# Patient Record
Sex: Male | Born: 1995 | Hispanic: Yes | Marital: Single | Smoking: Never smoker
Health system: Southern US, Community
[De-identification: ages and names within clinical notes are randomized; demographics above are authoritative.]

## PROBLEM LIST (undated history)

## (undated) DIAGNOSIS — J189 Pneumonia, unspecified organism: Secondary | ICD-10-CM

## (undated) HISTORY — PX: ELBOW SURGERY: SHX618

---
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
## 2018-09-15 DIAGNOSIS — M25349 Other instability, unspecified hand: Secondary | ICD-10-CM | POA: Insufficient documentation

## 2018-12-31 DIAGNOSIS — Z969 Presence of functional implant, unspecified: Secondary | ICD-10-CM | POA: Insufficient documentation

## 2019-01-15 DIAGNOSIS — M25632 Stiffness of left wrist, not elsewhere classified: Secondary | ICD-10-CM | POA: Insufficient documentation

## 2019-01-15 DIAGNOSIS — Z9889 Other specified postprocedural states: Secondary | ICD-10-CM | POA: Insufficient documentation

## 2023-03-15 ENCOUNTER — Inpatient Hospital Stay (HOSPITAL_COMMUNITY): Payer: Self-pay

## 2023-03-15 ENCOUNTER — Emergency Department (HOSPITAL_COMMUNITY): Payer: Self-pay | Admitting: Anesthesiology

## 2023-03-15 ENCOUNTER — Emergency Department (HOSPITAL_COMMUNITY): Payer: Self-pay

## 2023-03-15 ENCOUNTER — Encounter (HOSPITAL_COMMUNITY): Admission: EM | Disposition: A | Discharge status: Rehabilitation Facility | Payer: Self-pay | Source: Home / Self Care

## 2023-03-15 ENCOUNTER — Encounter (HOSPITAL_COMMUNITY): Payer: Self-pay

## 2023-03-15 ENCOUNTER — Inpatient Hospital Stay (HOSPITAL_COMMUNITY)
Admission: EM | Admit: 2023-03-15 | Discharge: 2023-03-28 | DRG: 957 | Disposition: A | Discharge status: Rehabilitation Facility | Payer: Self-pay | Attending: General Surgery | Admitting: General Surgery

## 2023-03-15 DIAGNOSIS — S329XXB Fracture of unspecified parts of lumbosacral spine and pelvis, initial encounter for open fracture: Secondary | ICD-10-CM

## 2023-03-15 DIAGNOSIS — S334XXA Traumatic rupture of symphysis pubis, initial encounter: Secondary | ICD-10-CM | POA: Diagnosis present

## 2023-03-15 DIAGNOSIS — T794XXA Traumatic shock, initial encounter: Secondary | ICD-10-CM | POA: Diagnosis present

## 2023-03-15 DIAGNOSIS — S79911A Unspecified injury of right hip, initial encounter: Secondary | ICD-10-CM

## 2023-03-15 DIAGNOSIS — T148XXA Other injury of unspecified body region, initial encounter: Secondary | ICD-10-CM

## 2023-03-15 DIAGNOSIS — T1490XA Injury, unspecified, initial encounter: Principal | ICD-10-CM

## 2023-03-15 DIAGNOSIS — S32810B Multiple fractures of pelvis with stable disruption of pelvic ring, initial encounter for open fracture: Secondary | ICD-10-CM | POA: Diagnosis present

## 2023-03-15 DIAGNOSIS — S32811B Multiple fractures of pelvis with unstable disruption of pelvic ring, initial encounter for open fracture: Principal | ICD-10-CM | POA: Diagnosis present

## 2023-03-15 DIAGNOSIS — S3733XA Laceration of urethra, initial encounter: Secondary | ICD-10-CM | POA: Diagnosis present

## 2023-03-15 DIAGNOSIS — S53104A Unspecified dislocation of right ulnohumeral joint, initial encounter: Secondary | ICD-10-CM | POA: Diagnosis present

## 2023-03-15 DIAGNOSIS — D62 Acute posthemorrhagic anemia: Secondary | ICD-10-CM | POA: Diagnosis present

## 2023-03-15 DIAGNOSIS — S72431B Displaced fracture of medial condyle of right femur, initial encounter for open fracture type I or II: Secondary | ICD-10-CM | POA: Diagnosis present

## 2023-03-15 DIAGNOSIS — S3729XA Other injury of bladder, initial encounter: Secondary | ICD-10-CM | POA: Diagnosis present

## 2023-03-15 DIAGNOSIS — J189 Pneumonia, unspecified organism: Secondary | ICD-10-CM | POA: Diagnosis not present

## 2023-03-15 DIAGNOSIS — S52121A Displaced fracture of head of right radius, initial encounter for closed fracture: Secondary | ICD-10-CM | POA: Diagnosis present

## 2023-03-15 DIAGNOSIS — J9601 Acute respiratory failure with hypoxia: Secondary | ICD-10-CM | POA: Diagnosis present

## 2023-03-15 DIAGNOSIS — S32402A Unspecified fracture of left acetabulum, initial encounter for closed fracture: Secondary | ICD-10-CM | POA: Diagnosis present

## 2023-03-15 DIAGNOSIS — S3131XA Laceration without foreign body of scrotum and testes, initial encounter: Secondary | ICD-10-CM | POA: Diagnosis present

## 2023-03-15 DIAGNOSIS — S91012A Laceration without foreign body, left ankle, initial encounter: Secondary | ICD-10-CM | POA: Diagnosis present

## 2023-03-15 DIAGNOSIS — S82202C Unspecified fracture of shaft of left tibia, initial encounter for open fracture type IIIA, IIIB, or IIIC: Secondary | ICD-10-CM | POA: Diagnosis present

## 2023-03-15 DIAGNOSIS — Z23 Encounter for immunization: Secondary | ICD-10-CM | POA: Diagnosis not present

## 2023-03-15 DIAGNOSIS — D6959 Other secondary thrombocytopenia: Secondary | ICD-10-CM | POA: Diagnosis present

## 2023-03-15 DIAGNOSIS — S82832A Other fracture of upper and lower end of left fibula, initial encounter for closed fracture: Secondary | ICD-10-CM | POA: Diagnosis present

## 2023-03-15 DIAGNOSIS — S32401A Unspecified fracture of right acetabulum, initial encounter for closed fracture: Secondary | ICD-10-CM | POA: Diagnosis present

## 2023-03-15 DIAGNOSIS — Y92488 Other paved roadways as the place of occurrence of the external cause: Secondary | ICD-10-CM

## 2023-03-15 DIAGNOSIS — S332XXA Dislocation of sacroiliac and sacrococcygeal joint, initial encounter: Secondary | ICD-10-CM | POA: Diagnosis present

## 2023-03-15 DIAGNOSIS — S42401A Unspecified fracture of lower end of right humerus, initial encounter for closed fracture: Secondary | ICD-10-CM | POA: Diagnosis present

## 2023-03-15 DIAGNOSIS — S92142A Displaced dome fracture of left talus, initial encounter for closed fracture: Secondary | ICD-10-CM | POA: Diagnosis present

## 2023-03-15 HISTORY — PX: CYSTOSCOPY WITH RETROGRADE URETHROGRAM: SHX6309

## 2023-03-15 HISTORY — PX: EXTERNAL FIXATION PELVIS: SHX1551

## 2023-03-15 HISTORY — PX: EXTERNAL FIXATION LEG: SHX1549

## 2023-03-15 HISTORY — PX: HIP CLOSED REDUCTION: SHX983

## 2023-03-15 HISTORY — PX: INSERTION OF SUPRAPUBIC CATHETER: SHX5870

## 2023-03-15 HISTORY — PX: BLADDER REPAIR: SHX6721

## 2023-03-15 HISTORY — PX: LACERATION REPAIR: SHX5168

## 2023-03-15 HISTORY — PX: I & D EXTREMITY: SHX5045

## 2023-03-15 HISTORY — PX: CLOSED REDUCTION RADIAL SHAFT: SHX5008

## 2023-03-15 LAB — POCT I-STAT 7, (LYTES, BLD GAS, ICA,H+H)
Acid-base deficit: 10 mmol/L — ABNORMAL HIGH (ref 0.0–2.0)
Acid-base deficit: 1 mmol/L (ref 0.0–2.0)
Bicarbonate: 18.2 mmol/L — ABNORMAL LOW (ref 20.0–28.0)
Bicarbonate: 24 mmol/L (ref 20.0–28.0)
Calcium, Ion: 0.36 mmol/L — CL (ref 1.15–1.40)
Calcium, Ion: 1.06 mmol/L — ABNORMAL LOW (ref 1.15–1.40)
HCT: 36 % — ABNORMAL LOW (ref 39.0–52.0)
HCT: 41 % (ref 39.0–52.0)
Hemoglobin: 12.2 g/dL — ABNORMAL LOW (ref 13.0–17.0)
Hemoglobin: 13.9 g/dL (ref 13.0–17.0)
O2 Saturation: 100 %
O2 Saturation: 100 %
Potassium: 3.5 mmol/L (ref 3.5–5.1)
Potassium: 3.7 mmol/L (ref 3.5–5.1)
Sodium: 143 mmol/L (ref 135–145)
Sodium: 142 mmol/L (ref 135–145)
TCO2: 20 mmol/L — ABNORMAL LOW (ref 22–32)
TCO2: 25 mmol/L (ref 22–32)
pCO2 arterial: 46.7 mmHg (ref 32–48)
pCO2 arterial: 40.7 mmHg (ref 32–48)
pH, Arterial: 7.199 — CL (ref 7.35–7.45)
pH, Arterial: 7.379 (ref 7.35–7.45)
pO2, Arterial: 494 mmHg — ABNORMAL HIGH (ref 83–108)
pO2, Arterial: 179 mmHg — ABNORMAL HIGH (ref 83–108)

## 2023-03-15 LAB — ABO/RH: ABO/RH(D): O POS

## 2023-03-15 LAB — BPAM FFP
Blood Product Expiration Date: 202406192359
Blood Product Expiration Date: 202406192359
Blood Product Expiration Date: 202406292359
ISSUE DATE / TIME: 202406151906
ISSUE DATE / TIME: 202406151906
ISSUE DATE / TIME: 202406151906
ISSUE DATE / TIME: 202406151936
ISSUE DATE / TIME: 202406151936
ISSUE DATE / TIME: 202406151942
Unit Type and Rh: 6200
Unit Type and Rh: 6200

## 2023-03-15 LAB — PREPARE FRESH FROZEN PLASMA
Unit division: 0
Unit division: 0
Unit division: 0
Unit division: 0
Unit division: 0

## 2023-03-15 LAB — TYPE AND SCREEN
Unit division: 0
Unit division: 0
Unit division: 0
Unit division: 0
Unit division: 0

## 2023-03-15 LAB — BPAM PLATELET PHERESIS: ISSUE DATE / TIME: 202406151942

## 2023-03-15 LAB — BPAM RBC
ISSUE DATE / TIME: 202406151906
ISSUE DATE / TIME: 202406151906
ISSUE DATE / TIME: 202406151906
ISSUE DATE / TIME: 202406151906
ISSUE DATE / TIME: 202406151911
ISSUE DATE / TIME: 202406151911
ISSUE DATE / TIME: 202406151936

## 2023-03-15 LAB — PREPARE CRYOPRECIPITATE

## 2023-03-15 LAB — PREPARE PLATELET PHERESIS: Unit division: 0

## 2023-03-15 SURGERY — IRRIGATION AND DEBRIDEMENT EXTREMITY
Anesthesia: General | Site: Hip | Laterality: Left

## 2023-03-15 MED ORDER — TRANEXAMIC ACID 1000 MG/10ML IV SOLN
1000.0000 mg | Freq: Once | INTRAVENOUS | Status: AC
  Administered 2023-03-16: 1000 mg via INTRAVENOUS
  Filled 2023-03-15: qty 10

## 2023-03-15 MED ORDER — ALBUMIN HUMAN 5 % IV SOLN: INTRAVENOUS | Status: DC | PRN

## 2023-03-15 MED ORDER — SODIUM CHLORIDE 0.9 % IV SOLN: INTRAVENOUS | Status: DC | PRN

## 2023-03-15 MED ORDER — MIDAZOLAM HCL 2 MG/2ML IJ SOLN
INTRAMUSCULAR | Status: AC
  Filled 2023-03-15: qty 2

## 2023-03-15 MED ORDER — LACTATED RINGERS IV SOLN: INTRAVENOUS | Status: DC | PRN

## 2023-03-15 MED ORDER — CALCIUM GLUCONATE-NACL 2-0.675 GM/100ML-% IV SOLN
2.0000 g | Freq: Once | INTRAVENOUS | Status: AC
  Administered 2023-03-15: 2000 mg via INTRAVENOUS
  Filled 2023-03-15: qty 100

## 2023-03-15 MED ORDER — METRONIDAZOLE 500 MG/100ML IV SOLN
500.0000 mg | INTRAVENOUS | Status: AC
  Administered 2023-03-15: 500 mg via INTRAVENOUS
  Filled 2023-03-15 (×2): qty 100

## 2023-03-15 MED ORDER — 0.9 % SODIUM CHLORIDE (POUR BTL) OPTIME
TOPICAL | Status: DC | PRN
  Administered 2023-03-15: 8000 mL

## 2023-03-15 MED ORDER — SODIUM BICARBONATE 8.4 % IV SOLN
INTRAVENOUS | Status: DC | PRN
  Administered 2023-03-15 (×2): 50 meq via INTRAVENOUS

## 2023-03-15 MED ORDER — SUCCINYLCHOLINE CHLORIDE 200 MG/10ML IV SOSY
PREFILLED_SYRINGE | INTRAVENOUS | Status: AC
  Filled 2023-03-15: qty 10

## 2023-03-15 MED ORDER — PHENYLEPHRINE 80 MCG/ML (10ML) SYRINGE FOR IV PUSH (FOR BLOOD PRESSURE SUPPORT)
PREFILLED_SYRINGE | INTRAVENOUS | Status: AC
  Filled 2023-03-15: qty 10

## 2023-03-15 MED ORDER — HYDROMORPHONE HCL 1 MG/ML IJ SOLN
INTRAMUSCULAR | Status: AC
  Filled 2023-03-15: qty 0.5

## 2023-03-15 MED ORDER — PROPOFOL 1000 MG/100ML IV EMUL
INTRAVENOUS | Status: AC
  Filled 2023-03-15: qty 100

## 2023-03-15 MED ORDER — MIDAZOLAM HCL 2 MG/2ML IJ SOLN
INTRAMUSCULAR | Status: DC | PRN
  Administered 2023-03-15 (×2): 2 mg via INTRAVENOUS

## 2023-03-15 MED ORDER — CEFAZOLIN SODIUM-DEXTROSE 2-3 GM-%(50ML) IV SOLR
INTRAVENOUS | Status: DC | PRN
  Administered 2023-03-15: 2 g via INTRAVENOUS

## 2023-03-15 MED ORDER — CEFAZOLIN SODIUM 1 G IJ SOLR
INTRAMUSCULAR | Status: AC
  Filled 2023-03-15: qty 20

## 2023-03-15 MED ORDER — TRANEXAMIC ACID-NACL 1000-0.7 MG/100ML-% IV SOLN
1000.0000 mg | Freq: Once | INTRAVENOUS | Status: AC
  Administered 2023-03-15: 1000 mg via INTRAVENOUS
  Filled 2023-03-15: qty 100

## 2023-03-15 MED ORDER — CALCIUM CHLORIDE 10 % IV SOLN
INTRAVENOUS | Status: DC | PRN
  Administered 2023-03-15: 1 g via INTRAVENOUS

## 2023-03-15 MED ORDER — STERILE WATER FOR IRRIGATION IR SOLN
Status: DC | PRN
  Administered 2023-03-15: 2000 mL

## 2023-03-15 MED ORDER — PHENYLEPHRINE HCL-NACL 20-0.9 MG/250ML-% IV SOLN
INTRAVENOUS | Status: DC | PRN
  Administered 2023-03-15: 15 ug/min via INTRAVENOUS

## 2023-03-15 MED ORDER — CEFAZOLIN SODIUM-DEXTROSE 1-4 GM/50ML-% IV SOLN: 1.0000 g | Freq: Three times a day (TID) | INTRAVENOUS | Status: DC

## 2023-03-15 MED ORDER — FENTANYL CITRATE (PF) 250 MCG/5ML IJ SOLN
INTRAMUSCULAR | Status: DC | PRN
  Administered 2023-03-15 (×3): 50 ug via INTRAVENOUS
  Administered 2023-03-15: 100 ug via INTRAVENOUS

## 2023-03-15 MED ORDER — ROCURONIUM BROMIDE 10 MG/ML (PF) SYRINGE
PREFILLED_SYRINGE | INTRAVENOUS | Status: AC
  Filled 2023-03-15: qty 20

## 2023-03-15 MED ORDER — ROCURONIUM BROMIDE 10 MG/ML (PF) SYRINGE
PREFILLED_SYRINGE | INTRAVENOUS | Status: DC | PRN
  Administered 2023-03-15: 20 mg via INTRAVENOUS
  Administered 2023-03-15: 80 mg via INTRAVENOUS
  Administered 2023-03-15 (×3): 50 mg via INTRAVENOUS

## 2023-03-15 MED ORDER — ROCURONIUM BROMIDE 10 MG/ML (PF) SYRINGE
PREFILLED_SYRINGE | INTRAVENOUS | Status: AC
  Filled 2023-03-15: qty 10

## 2023-03-15 MED ORDER — BUPIVACAINE HCL 0.25 % IJ SOLN
INTRAMUSCULAR | Status: DC | PRN
  Administered 2023-03-15: 10 mL

## 2023-03-15 MED ORDER — FENTANYL CITRATE (PF) 250 MCG/5ML IJ SOLN
INTRAMUSCULAR | Status: AC
  Filled 2023-03-15: qty 5

## 2023-03-15 MED ORDER — LIDOCAINE 2% (20 MG/ML) 5 ML SYRINGE
INTRAMUSCULAR | Status: AC
  Filled 2023-03-15: qty 5

## 2023-03-15 MED ORDER — TETANUS-DIPHTH-ACELL PERTUSSIS 5-2.5-18.5 LF-MCG/0.5 IM SUSY
0.5000 mL | PREFILLED_SYRINGE | Freq: Once | INTRAMUSCULAR | Status: AC
  Administered 2023-03-15: 0.5 mL via INTRAMUSCULAR
  Filled 2023-03-15: qty 0.5

## 2023-03-15 MED ORDER — EPHEDRINE 5 MG/ML INJ
INTRAVENOUS | Status: AC
  Filled 2023-03-15: qty 5

## 2023-03-15 MED ORDER — CEFAZOLIN SODIUM-DEXTROSE 2-4 GM/100ML-% IV SOLN
2.0000 g | Freq: Once | INTRAVENOUS | Status: AC
  Administered 2023-03-15: 2 g via INTRAVENOUS
  Filled 2023-03-15: qty 100

## 2023-03-15 MED ORDER — LIDOCAINE 2% (20 MG/ML) 5 ML SYRINGE
INTRAMUSCULAR | Status: DC | PRN
  Administered 2023-03-15: 60 mg via INTRAVENOUS

## 2023-03-15 MED ORDER — CALCIUM GLUCONATE-NACL 1-0.675 GM/50ML-% IV SOLN
1.0000 g | Freq: Once | INTRAVENOUS | Status: DC
  Filled 2023-03-15: qty 50

## 2023-03-15 MED ORDER — PROPOFOL 500 MG/50ML IV EMUL
INTRAVENOUS | Status: DC | PRN
  Administered 2023-03-15: 75 ug/kg/min via INTRAVENOUS

## 2023-03-15 MED ORDER — SUCCINYLCHOLINE CHLORIDE 200 MG/10ML IV SOSY
PREFILLED_SYRINGE | INTRAVENOUS | Status: DC | PRN
  Administered 2023-03-15: 140 mg via INTRAVENOUS

## 2023-03-15 MED ORDER — IOHEXOL 300 MG/ML  SOLN
INTRAMUSCULAR | Status: DC | PRN
  Administered 2023-03-15: 25 mL

## 2023-03-15 MED ORDER — HYDROMORPHONE HCL 1 MG/ML IJ SOLN
INTRAMUSCULAR | Status: DC | PRN
  Administered 2023-03-15: .5 mg via INTRAVENOUS

## 2023-03-15 MED ORDER — CEFAZOLIN SODIUM-DEXTROSE 1-4 GM/50ML-% IV SOLN
1.0000 g | Freq: Three times a day (TID) | INTRAVENOUS | Status: DC
  Administered 2023-03-16 – 2023-03-18 (×7): 1 g via INTRAVENOUS
  Filled 2023-03-15 (×7): qty 50

## 2023-03-15 MED ORDER — BUPIVACAINE HCL (PF) 0.25 % IJ SOLN
INTRAMUSCULAR | Status: AC
  Filled 2023-03-15: qty 30

## 2023-03-15 MED ORDER — PROPOFOL 10 MG/ML IV BOLUS
INTRAVENOUS | Status: DC | PRN
  Administered 2023-03-15: 70 mg via INTRAVENOUS

## 2023-03-15 SURGICAL SUPPLY — 118 items
ADH SKN CLS APL DERMABOND .7 (GAUZE/BANDAGES/DRESSINGS) ×8
APL PRP STRL LF DISP 70% ISPRP (MISCELLANEOUS)
BAG COUNTER SPONGE SURGICOUNT (BAG) ×4 IMPLANT
BAG SPNG CNTER NS LX DISP (BAG) ×4
BIT DRILL QC 3.5X195 (BIT) ×1 IMPLANT
BLADE CLIPPER SURG (BLADE) IMPLANT
BLADE SURG 15 STRL LF DISP TIS (BLADE) ×3 IMPLANT
BLADE SURG 15 STRL SS (BLADE) ×12
BNDG CMPR MED 10X6 ELC LF (GAUZE/BANDAGES/DRESSINGS) ×4
BNDG COHESIVE 6X5 TAN NS LF (GAUZE/BANDAGES/DRESSINGS) ×2 IMPLANT
BNDG ELASTIC 6X10 VLCR STRL LF (GAUZE/BANDAGES/DRESSINGS) ×1 IMPLANT
BNDG GAUZE DERMACEA FLUFF 4 (GAUZE/BANDAGES/DRESSINGS) ×6 IMPLANT
BNDG GZE DERMACEA 4 6PLY (GAUZE/BANDAGES/DRESSINGS) ×24
CANISTER SUCT 3000ML PPV (MISCELLANEOUS) ×4 IMPLANT
CAP PROTECTIVE 5.0MM (EXFIX) ×1 IMPLANT
CATH COUDE FOLEY 2W 5CC 16FR (CATHETERS) IMPLANT
CATH FOL 3WAY LX 20X30 (CATHETERS) IMPLANT
CATH FOLEY 2W COUNCIL 5CC 16FR (CATHETERS) IMPLANT
CATH FOLEY 2WAY SLVR 30CC 20FR (CATHETERS) IMPLANT
CATH FOLEY 2WAY SLVR 5CC 14FR (CATHETERS) ×1 IMPLANT
CHLORAPREP W/TINT 26 (MISCELLANEOUS) ×3 IMPLANT
CLAMP ADJUSTABLE (Clamp) ×28 IMPLANT
CLAMP LG COMBINATION (Clamp) ×23 IMPLANT
CLAMP LRG PIN EXFX X4 NS (EXFIX) ×1 IMPLANT
CLAMP PIN LRG 6 POSITION (Clamp) ×1 IMPLANT
CLAMP TUBE TO TUBE (Trauma) ×1 IMPLANT
COVER SURGICAL LIGHT HANDLE (MISCELLANEOUS) ×5 IMPLANT
DERMABOND ADVANCED .7 DNX12 (GAUZE/BANDAGES/DRESSINGS) ×8 IMPLANT
DRAPE INCISE IOBAN 66X45 STRL (DRAPES) ×5 IMPLANT
DRAPE LAPAROSCOPIC ABDOMINAL (DRAPES) ×4 IMPLANT
DRAPE LAPAROTOMY 100X72X124 (DRAPES) ×1 IMPLANT
DRAPE WARM FLUID 44X44 (DRAPES) ×4 IMPLANT
DRESSING QUICKCLOT CNTRL 12X12 (GAUZE/BANDAGES/DRESSINGS) ×2 IMPLANT
DRESSING QUICKCLOT CNTRL ZFOLD (GAUZE/BANDAGES/DRESSINGS) ×5 IMPLANT
DRSG OPSITE POSTOP 4X10 (GAUZE/BANDAGES/DRESSINGS) IMPLANT
DRSG OPSITE POSTOP 4X8 (GAUZE/BANDAGES/DRESSINGS) IMPLANT
DRSG QUICKCLOT CNTRL 12X12 (GAUZE/BANDAGES/DRESSINGS) ×8
DRSG QUICKCLOT CNTRL ZFOLD (GAUZE/BANDAGES/DRESSINGS) ×20
DRSG XEROFORM 1X8 (GAUZE/BANDAGES/DRESSINGS) ×1 IMPLANT
ELECT BLADE 6.5 EXT (BLADE) IMPLANT
ELECT CAUTERY BLADE 6.4 (BLADE) ×4 IMPLANT
ELECT REM PT RETURN 9FT ADLT (ELECTROSURGICAL) ×4
ELECTRODE REM PT RTRN 9FT ADLT (ELECTROSURGICAL) ×4 IMPLANT
FIXATOR EXTERNAL ELBOW HINGE (EXFIX) ×2 IMPLANT
GAUZE PAD ABD 8X10 STRL (GAUZE/BANDAGES/DRESSINGS) ×2 IMPLANT
GAUZE SPONGE 4X4 12PLY STRL (GAUZE/BANDAGES/DRESSINGS) ×3 IMPLANT
GLOVE BIO SURGEON STRL SZ 6 (GLOVE) ×2 IMPLANT
GLOVE BIO SURGEON STRL SZ7 (GLOVE) ×1 IMPLANT
GLOVE BIO SURGEON STRL SZ7.5 (GLOVE) ×1 IMPLANT
GLOVE BIOGEL PI IND STRL 6 (GLOVE) ×5 IMPLANT
GLOVE BIOGEL PI IND STRL 7.0 (GLOVE) ×2 IMPLANT
GLOVE BIOGEL PI IND STRL 8 (GLOVE) ×2 IMPLANT
GLOVE BIOGEL PI MICRO STRL 5.5 (GLOVE) ×4 IMPLANT
GLOVE INDICATOR 7.0 STRL GRN (GLOVE) ×1 IMPLANT
GLOVE SURG SS PI 6.5 STRL IVOR (GLOVE) ×2 IMPLANT
GOWN STRL REUS W/ TWL LRG LVL3 (GOWN DISPOSABLE) ×8 IMPLANT
GOWN STRL REUS W/ TWL XL LVL3 (GOWN DISPOSABLE) ×1 IMPLANT
GOWN STRL REUS W/TWL LRG LVL3 (GOWN DISPOSABLE) ×8
GOWN STRL REUS W/TWL XL LVL3 (GOWN DISPOSABLE) ×4
GUIDEWIRE STR DUAL SENSOR (WIRE) ×1 IMPLANT
HANDLE SUCTION POOLE (INSTRUMENTS) ×4 IMPLANT
KIT BALLN UROMAX 15FX4 (MISCELLANEOUS) ×1 IMPLANT
KIT BASIN OR (CUSTOM PROCEDURE TRAY) ×4 IMPLANT
KIT TURNOVER KIT B (KITS) ×4 IMPLANT
LIGASURE IMPACT 36 18CM CVD LR (INSTRUMENTS) IMPLANT
NDL SPNL 18GX3.5 QUINCKE PK (NEEDLE) ×1 IMPLANT
NDL SPNL 22GX3.5 QUINCKE BK (NEEDLE) ×1 IMPLANT
NEEDLE SPNL 18GX3.5 QUINCKE PK (NEEDLE) ×4 IMPLANT
NEEDLE SPNL 22GX3.5 QUINCKE BK (NEEDLE) ×4 IMPLANT
NS IRRIG 1000ML POUR BTL (IV SOLUTION) ×8 IMPLANT
OUTRIG POST 30X11 MR SAFE (MISCELLANEOUS) ×8
PACK CYSTO (CUSTOM PROCEDURE TRAY) ×1 IMPLANT
PACK GENERAL/GYN (CUSTOM PROCEDURE TRAY) ×4 IMPLANT
PACK ORTHO EXTREMITY (CUSTOM PROCEDURE TRAY) ×1 IMPLANT
PAD ARMBOARD 7.5X6 YLW CONV (MISCELLANEOUS) ×4 IMPLANT
PENCIL BUTTON HOLSTER BLD 10FT (ELECTRODE) IMPLANT
PENCIL SMOKE EVACUATOR (MISCELLANEOUS) ×4 IMPLANT
PIN CALCANEAL (PIN) ×1 IMPLANT
PLUG CATH AND CAP STER (CATHETERS) ×1 IMPLANT
POST OUTRIGGER 30X11 MR SAFE (MISCELLANEOUS) ×2 IMPLANT
ROD CRBN FBR LRG EX-FX 11X300 (Rod) ×1 IMPLANT
ROD CRBN FBR LRG EX-FX 11X400 (Rod) ×2 IMPLANT
SCREW SCHNZ SD 5.0 60 THRD/150 (Screw) ×1 IMPLANT
SCREW SHANZ 5.0X175MM (Screw) ×2 IMPLANT
SCRW SCHANZ SD 5.0 60 THRD/150 (Screw) ×4 IMPLANT
SLEEVE SUCTION CATH 165 (SLEEVE) ×4 IMPLANT
SPECIMEN JAR LARGE (MISCELLANEOUS) IMPLANT
SPIKE FLUID TRANSFER (MISCELLANEOUS) ×1 IMPLANT
SPONGE T-LAP 18X18 ~~LOC~~+RFID (SPONGE) IMPLANT
STAPLER VISISTAT 35W (STAPLE) ×1 IMPLANT
SUCTION POOLE HANDLE (INSTRUMENTS) ×4
SUT ETHILON 2 0 PSLX (SUTURE) ×2 IMPLANT
SUT MNCRL AB 4-0 PS2 18 (SUTURE) ×8 IMPLANT
SUT PDS AB 1 TP1 96 (SUTURE) ×8 IMPLANT
SUT SILK 2 0 (SUTURE) ×4
SUT SILK 2 0 SH CR/8 (SUTURE) ×5 IMPLANT
SUT SILK 2 0 TIES 10X30 (SUTURE) ×4 IMPLANT
SUT SILK 2-0 18XBRD TIE 12 (SUTURE) ×1 IMPLANT
SUT SILK 3 0 (SUTURE) ×4
SUT SILK 3 0 SH CR/8 (SUTURE) ×5 IMPLANT
SUT SILK 3 0 TIES 10X30 (SUTURE) ×4 IMPLANT
SUT SILK 3-0 18XBRD TIE 12 (SUTURE) ×1 IMPLANT
SUT VIC AB 2-0 SH 27 (SUTURE) ×4
SUT VIC AB 2-0 SH 27XBRD (SUTURE) ×1 IMPLANT
SUT VIC AB 2-0 UR6 27 (SUTURE) ×2 IMPLANT
SUT VIC AB 3-0 SH 18 (SUTURE) IMPLANT
SUT VIC AB 3-0 SH 27 (SUTURE) ×8
SUT VIC AB 3-0 SH 27XBRD (SUTURE) ×8 IMPLANT
SUT VIC AB 4-0 SH 18 (SUTURE) ×1 IMPLANT
SUT VICRYL 0 UR6 27IN ABS (SUTURE) ×2 IMPLANT
SYR TOOMEY IRRIG 70ML (MISCELLANEOUS) ×4
SYRINGE TOOMEY IRRIG 70ML (MISCELLANEOUS) ×1 IMPLANT
TOWEL GREEN STERILE (TOWEL DISPOSABLE) ×5 IMPLANT
TRAY FOLEY MTR SLVR 16FR STAT (SET/KITS/TRAYS/PACK) IMPLANT
TRAY FOLEY W/BAG SLVR 14FR (SET/KITS/TRAYS/PACK) IMPLANT
TRAY IRRIG W/60CC SYR STRL (SET/KITS/TRAYS/PACK) ×1 IMPLANT
TUBING CONNECTING 10 (TUBING) ×1 IMPLANT
YANKAUER SUCT BULB TIP NO VENT (SUCTIONS) IMPLANT
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
## 2023-03-15 NOTE — Anesthesia Procedure Notes (Addendum)
Procedure Name: Intubation Date/Time: 03/15/2023 7:25 PM  Performed by: Alease Medina, CRNAPre-anesthesia Checklist: Patient identified, Emergency Drugs available, Suction available and Patient being monitored Patient Re-evaluated:Patient Re-evaluated prior to induction Oxygen Delivery Method: Circle system utilized Preoxygenation: Pre-oxygenation with 100% oxygen Induction Type: IV induction, Rapid sequence and Cricoid Pressure applied Laryngoscope Size: Glidescope and 4 Grade View: Grade I Tube type: Oral Tube size: 7.5 mm Number of attempts: 1 Airway Equipment and Method: Stylet and Video-laryngoscopy Placement Confirmation: ETT inserted through vocal cords under direct vision, positive ETCO2 and breath sounds checked- equal and bilateral Secured at: 23 cm Tube secured with: Tape Dental Injury: Teeth and Oropharynx as per pre-operative assessment  Comments: Head and neck maintained in neutral spine position for intubation
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
## 2023-03-15 NOTE — Progress Notes (Signed)
Perineal wound was again examined after placement of SPT. There was diffuse slow venous oozing but no arterial bleeding. The wound was repacked with 3 strips of quik-clot (1 in the space of Retzius, 2 in the perineum). Wound was covered with kerlex.
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
## 2023-03-15 NOTE — Procedures (Signed)
Foley placement attempted in the OR. There was resistance to advancement of the catheter, with return of frank blood. Further attempts at placement were aborted due to concern for a urethral injury. Urology was consulted intra-op.
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
## 2023-03-15 NOTE — ED Notes (Addendum)
Tourniquet removed at this time to RLE Tourniquet was applied at 1830

## 2023-03-16 ENCOUNTER — Inpatient Hospital Stay (HOSPITAL_COMMUNITY): Payer: Self-pay

## 2023-03-16 LAB — BPAM FFP
Blood Product Expiration Date: 202406162359
Blood Product Expiration Date: 202406192359
Blood Product Expiration Date: 202406192359
Blood Product Expiration Date: 202406192359
Blood Product Expiration Date: 202406192359
Blood Product Expiration Date: 202406192359
Blood Product Expiration Date: 202406192359
Blood Product Expiration Date: 202406192359
Blood Product Expiration Date: 202406192359
Blood Product Expiration Date: 202406192359
Blood Product Expiration Date: 202406202359
Blood Product Expiration Date: 202406202359
Blood Product Expiration Date: 202406232359
Blood Product Expiration Date: 202406232359
Blood Product Expiration Date: 202406232359
Blood Product Expiration Date: 202406272359
Blood Product Expiration Date: 202406292359
Blood Product Expiration Date: 202406292359
Blood Product Expiration Date: 202407052359
Blood Product Expiration Date: 202407072359
ISSUE DATE / TIME: 202406151903
ISSUE DATE / TIME: 202406151903
ISSUE DATE / TIME: 202406151906
ISSUE DATE / TIME: 202406151906
ISSUE DATE / TIME: 202406151906
ISSUE DATE / TIME: 202406151906
ISSUE DATE / TIME: 202406151906
ISSUE DATE / TIME: 202406151906
ISSUE DATE / TIME: 202406151910
ISSUE DATE / TIME: 202406151911
ISSUE DATE / TIME: 202406151911
ISSUE DATE / TIME: 202406151911
ISSUE DATE / TIME: 202406151911
ISSUE DATE / TIME: 202406151936
ISSUE DATE / TIME: 202406151936
ISSUE DATE / TIME: 202406151942
ISSUE DATE / TIME: 202406151942
ISSUE DATE / TIME: 202406151948
ISSUE DATE / TIME: 202406151948
Unit Type and Rh: 6200
Unit Type and Rh: 6200
Unit Type and Rh: 6200
Unit Type and Rh: 6200
Unit Type and Rh: 6200
Unit Type and Rh: 6200
Unit Type and Rh: 6200
Unit Type and Rh: 6200
Unit Type and Rh: 6200
Unit Type and Rh: 6200
Unit Type and Rh: 6200
Unit Type and Rh: 6200
Unit Type and Rh: 6200
Unit Type and Rh: 6200
Unit Type and Rh: 6200
Unit Type and Rh: 6200
Unit Type and Rh: 6200
Unit Type and Rh: 6200
Unit Type and Rh: 6200
Unit Type and Rh: 6200

## 2023-03-16 LAB — COMPREHENSIVE METABOLIC PANEL
ALT: 296 U/L — ABNORMAL HIGH (ref 0–44)
AST: 510 U/L — ABNORMAL HIGH (ref 15–41)
Albumin: 3.6 g/dL (ref 3.5–5.0)
Alkaline Phosphatase: 48 U/L (ref 38–126)
Anion gap: 14 (ref 5–15)
BUN: 12 mg/dL (ref 6–20)
CO2: 22 mmol/L (ref 22–32)
Calcium: 8.2 mg/dL — ABNORMAL LOW (ref 8.9–10.3)
Chloride: 100 mmol/L (ref 98–111)
Creatinine, Ser: 0.97 mg/dL (ref 0.61–1.24)
GFR, Estimated: >60 mL/min (ref >60)
Glucose, Bld: 118 mg/dL — ABNORMAL HIGH (ref 70–99)
Potassium: 3.1 mmol/L — ABNORMAL LOW (ref 3.5–5.1)
Sodium: 136 mmol/L (ref 135–145)
Total Bilirubin: 2.4 mg/dL — ABNORMAL HIGH (ref 0.3–1.2)
Total Protein: 5.7 g/dL — ABNORMAL LOW (ref 6.5–8.1)

## 2023-03-16 LAB — BPAM CRYOPRECIPITATE
Blood Product Expiration Date: 202406192359
ISSUE DATE / TIME: 202406151911
Unit Type and Rh: 5100

## 2023-03-16 LAB — POCT I-STAT 7, (LYTES, BLD GAS, ICA,H+H)
Acid-Base Excess: 0 mmol/L (ref 0.0–2.0)
Acid-base deficit: 2 mmol/L (ref 0.0–2.0)
Bicarbonate: 23.8 mmol/L (ref 20.0–28.0)
Bicarbonate: 24.5 mmol/L (ref 20.0–28.0)
Calcium, Ion: 1.08 mmol/L — ABNORMAL LOW (ref 1.15–1.40)
Calcium, Ion: 1.15 mmol/L (ref 1.15–1.40)
HCT: 36 % — ABNORMAL LOW (ref 39.0–52.0)
HCT: 37 % — ABNORMAL LOW (ref 39.0–52.0)
Hemoglobin: 12.2 g/dL — ABNORMAL LOW (ref 13.0–17.0)
Hemoglobin: 12.6 g/dL — ABNORMAL LOW (ref 13.0–17.0)
O2 Saturation: 100 %
O2 Saturation: 100 %
Patient temperature: 34.7
Potassium: 3 mmol/L — ABNORMAL LOW (ref 3.5–5.1)
Potassium: 3.3 mmol/L — ABNORMAL LOW (ref 3.5–5.1)
Sodium: 142 mmol/L (ref 135–145)
Sodium: 142 mmol/L (ref 135–145)
TCO2: 25 mmol/L (ref 22–32)
TCO2: 26 mmol/L (ref 22–32)
pCO2 arterial: 36.3 mmHg (ref 32–48)
pCO2 arterial: 41.7 mmHg (ref 32–48)
pH, Arterial: 7.425 (ref 7.35–7.45)
pH, Arterial: 7.366 (ref 7.35–7.45)
pO2, Arterial: 216 mmHg — ABNORMAL HIGH (ref 83–108)
pO2, Arterial: 199 mmHg — ABNORMAL HIGH (ref 83–108)

## 2023-03-16 LAB — BASIC METABOLIC PANEL
Anion gap: 15 (ref 5–15)
BUN: 13 mg/dL (ref 6–20)
CO2: 25 mmol/L (ref 22–32)
Calcium: 8.2 mg/dL — ABNORMAL LOW (ref 8.9–10.3)
Chloride: 101 mmol/L (ref 98–111)
Creatinine, Ser: 0.92 mg/dL (ref 0.61–1.24)
GFR, Estimated: >60 mL/min (ref >60)
Glucose, Bld: 115 mg/dL — ABNORMAL HIGH (ref 70–99)
Potassium: 4.2 mmol/L (ref 3.5–5.1)
Sodium: 141 mmol/L (ref 135–145)

## 2023-03-16 LAB — PREPARE FRESH FROZEN PLASMA
Unit division: 0
Unit division: 0
Unit division: 0
Unit division: 0
Unit division: 0
Unit division: 0
Unit division: 0
Unit division: 0
Unit division: 0
Unit division: 0
Unit division: 0
Unit division: 0
Unit division: 0
Unit division: 0
Unit division: 0
Unit division: 0
Unit division: 0

## 2023-03-16 LAB — CBC
HCT: 37.1 % — ABNORMAL LOW (ref 39.0–52.0)
HCT: 33.5 % — ABNORMAL LOW (ref 39.0–52.0)
Hemoglobin: 13 g/dL (ref 13.0–17.0)
Hemoglobin: 11.6 g/dL — ABNORMAL LOW (ref 13.0–17.0)
MCH: 30.3 pg (ref 26.0–34.0)
MCH: 30.4 pg (ref 26.0–34.0)
MCHC: 35 g/dL (ref 30.0–36.0)
MCHC: 34.6 g/dL (ref 30.0–36.0)
MCV: 86.5 fL (ref 80.0–100.0)
MCV: 87.7 fL (ref 80.0–100.0)
Platelets: 116 10*3/uL — ABNORMAL LOW (ref 150–400)
Platelets: 117 10*3/uL — ABNORMAL LOW (ref 150–400)
RBC: 4.29 MIL/uL (ref 4.22–5.81)
RBC: 3.82 MIL/uL — ABNORMAL LOW (ref 4.22–5.81)
RDW: 14.2 % (ref 11.5–15.5)
RDW: 15 % (ref 11.5–15.5)
WBC: 8.1 10*3/uL (ref 4.0–10.5)
WBC: 9.4 10*3/uL (ref 4.0–10.5)
nRBC: 0 % (ref 0.0–0.2)
nRBC: 0 % (ref 0.0–0.2)

## 2023-03-16 LAB — PREPARE PLATELET PHERESIS: Unit division: 0

## 2023-03-16 LAB — BPAM PLATELET PHERESIS
Blood Product Expiration Date: 202406162359
Blood Product Expiration Date: 202406172359
ISSUE DATE / TIME: 202406151907
Unit Type and Rh: 6200
Unit Type and Rh: 6200

## 2023-03-16 LAB — MRSA NEXT GEN BY PCR, NASAL: MRSA by PCR Next Gen: NOT DETECTED

## 2023-03-16 LAB — HIV ANTIBODY (ROUTINE TESTING W REFLEX): HIV Screen 4th Generation wRfx: NONREACTIVE

## 2023-03-16 LAB — PREPARE CRYOPRECIPITATE: Unit division: 0

## 2023-03-16 LAB — PROTIME-INR
INR: 1.3 — ABNORMAL HIGH (ref 0.8–1.2)
Prothrombin Time: 16.5 seconds — ABNORMAL HIGH (ref 11.4–15.2)

## 2023-03-16 LAB — PREPARE RBC (CROSSMATCH)

## 2023-03-16 LAB — BPAM RBC
Blood Product Expiration Date: 202407122359
Blood Product Expiration Date: 202407202359
Blood Product Expiration Date: 202407202359
Blood Product Expiration Date: 202407232359
ISSUE DATE / TIME: 202406151906
ISSUE DATE / TIME: 202406151942
Unit Type and Rh: 5100
Unit Type and Rh: 5100
Unit Type and Rh: 5100

## 2023-03-16 LAB — TYPE AND SCREEN
Unit division: 0
Unit division: 0
Unit division: 0

## 2023-03-16 LAB — MAGNESIUM: Magnesium: 1.5 mg/dL — ABNORMAL LOW (ref 1.7–2.4)

## 2023-03-16 MED ORDER — ACETAMINOPHEN 500 MG PO TABS
1000.0000 mg | ORAL_TABLET | Freq: Four times a day (QID) | ORAL | Status: DC
  Administered 2023-03-16 – 2023-03-20 (×14): 1000 mg
  Filled 2023-03-16 (×16): qty 2

## 2023-03-16 MED ORDER — CHLORHEXIDINE GLUCONATE CLOTH 2 % EX PADS
6.0000 | MEDICATED_PAD | Freq: Every day | CUTANEOUS | Status: DC
  Administered 2023-03-16 – 2023-03-27 (×13): 6 via TOPICAL

## 2023-03-16 MED ORDER — MAGNESIUM SULFATE 2 GM/50ML IV SOLN
2.0000 g | Freq: Once | INTRAVENOUS | Status: AC
  Administered 2023-03-16: 2 g via INTRAVENOUS
  Filled 2023-03-16: qty 50

## 2023-03-16 MED ORDER — FENTANYL BOLUS VIA INFUSION
50.0000 ug | INTRAVENOUS | Status: DC | PRN
  Administered 2023-03-16: 50 ug via INTRAVENOUS
  Administered 2023-03-16: 25 ug via INTRAVENOUS
  Administered 2023-03-17 (×2): 50 ug via INTRAVENOUS
  Administered 2023-03-17 – 2023-03-18 (×3): 100 ug via INTRAVENOUS
  Administered 2023-03-18: 50 ug via INTRAVENOUS

## 2023-03-16 MED ORDER — DOCUSATE SODIUM 50 MG/5ML PO LIQD
100.0000 mg | Freq: Two times a day (BID) | ORAL | Status: DC
  Administered 2023-03-17 – 2023-03-19 (×3): 100 mg
  Filled 2023-03-16 (×5): qty 10

## 2023-03-16 MED ORDER — ORAL CARE MOUTH RINSE
15.0000 mL | OROMUCOSAL | Status: DC
  Administered 2023-03-16 – 2023-03-18 (×24): 15 mL via OROMUCOSAL

## 2023-03-16 MED ORDER — POTASSIUM CHLORIDE 10 MEQ/50ML IV SOLN
10.0000 meq | INTRAVENOUS | Status: AC
  Administered 2023-03-16 (×3): 10 meq via INTRAVENOUS
  Filled 2023-03-16 (×3): qty 50

## 2023-03-16 MED ORDER — LACTATED RINGERS IV BOLUS
1000.0000 mL | Freq: Once | INTRAVENOUS | Status: AC
  Administered 2023-03-16: 1000 mL via INTRAVENOUS

## 2023-03-16 MED ORDER — LACTATED RINGERS IV SOLN: INTRAVENOUS | Status: DC

## 2023-03-16 MED ORDER — ORAL CARE MOUTH RINSE: 15.0000 mL | OROMUCOSAL | Status: DC | PRN

## 2023-03-16 MED ORDER — KETOROLAC TROMETHAMINE 15 MG/ML IJ SOLN
30.0000 mg | Freq: Four times a day (QID) | INTRAMUSCULAR | Status: AC
  Administered 2023-03-16 – 2023-03-21 (×18): 30 mg via INTRAVENOUS
  Filled 2023-03-16 (×19): qty 2

## 2023-03-16 MED ORDER — IOHEXOL 350 MG/ML SOLN
75.0000 mL | Freq: Once | INTRAVENOUS | Status: AC | PRN
  Administered 2023-03-16: 75 mL via INTRAVENOUS

## 2023-03-16 MED ORDER — LACTATED RINGERS IV BOLUS: 1000.0000 mL | Freq: Once | INTRAVENOUS | Status: DC | PRN

## 2023-03-16 MED ORDER — METHOCARBAMOL 500 MG PO TABS
1000.0000 mg | ORAL_TABLET | Freq: Three times a day (TID) | ORAL | Status: DC
  Administered 2023-03-16 – 2023-03-19 (×10): 1000 mg
  Filled 2023-03-16 (×11): qty 2

## 2023-03-16 MED ORDER — FENTANYL 2500MCG IN NS 250ML (10MCG/ML) PREMIX INFUSION
50.0000 ug/h | INTRAVENOUS | Status: DC
  Administered 2023-03-16 (×2): 100 ug/h via INTRAVENOUS
  Administered 2023-03-17: 150 ug/h via INTRAVENOUS
  Filled 2023-03-16 (×3): qty 250

## 2023-03-16 MED ORDER — PANTOPRAZOLE SODIUM 40 MG IV SOLR
40.0000 mg | Freq: Every day | INTRAVENOUS | Status: DC
  Administered 2023-03-16 – 2023-03-17 (×2): 40 mg via INTRAVENOUS
  Filled 2023-03-16 (×2): qty 10

## 2023-03-16 MED ORDER — PROPOFOL 1000 MG/100ML IV EMUL
0.0000 ug/kg/min | INTRAVENOUS | Status: DC
  Administered 2023-03-16 (×5): 50 ug/kg/min via INTRAVENOUS
  Administered 2023-03-16: 45 ug/kg/min via INTRAVENOUS
  Administered 2023-03-17: 50 ug/kg/min via INTRAVENOUS
  Filled 2023-03-16 (×6): qty 100

## 2023-03-16 MED ORDER — SODIUM CHLORIDE 0.9% IV SOLUTION: Freq: Once | INTRAVENOUS | Status: AC

## 2023-03-16 MED ORDER — POLYETHYLENE GLYCOL 3350 17 G PO PACK
17.0000 g | PACK | Freq: Every day | ORAL | Status: DC
  Administered 2023-03-19: 17 g
  Filled 2023-03-16: qty 1

## 2023-03-16 MED ORDER — OXYCODONE HCL 5 MG PO TABS
5.0000 mg | ORAL_TABLET | ORAL | Status: DC | PRN
  Administered 2023-03-16 – 2023-03-20 (×10): 10 mg
  Filled 2023-03-16 (×13): qty 2

## 2023-03-16 MED ORDER — FENTANYL CITRATE PF 50 MCG/ML IJ SOSY
50.0000 ug | PREFILLED_SYRINGE | Freq: Once | INTRAMUSCULAR | Status: AC
  Administered 2023-03-16: 50 ug via INTRAVENOUS

## 2023-03-16 NOTE — Progress Notes (Signed)
Patient connected to ventilator in CT and transported to 4N29 via the ventilator with no complications.
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
## 2023-03-16 NOTE — Progress Notes (Signed)
Due to family size point person for patients family has been established to be patients sister due to limited [place] with pts father Elita Quick, patients mother is deceased. Lashone Loduca [phone] will be primary contact and will share information with other family members as necessary, please direct family to [facility] for updates, a password has been established.

## 2023-03-16 NOTE — Transfer of Care (Signed)
Immediate Anesthesia Transfer of Care Note  Patient: Lot Wiles  Procedure(s) Performed: IRRIGATION AND DEBRIDEMENT EXTREMITY CLOSED REDUCTION HIP (Left: Hip) IRRIGATION AND DEBRIDEMENT EXTREMITY (Left) LACERATION REPAIR EXTERNAL FIXATION PELVIS EXTERNAL FIXATION LEG (Left) CLOSED REDUCTION SPLINT APPLICATION RIGHT ARM CYSTOSCOPY WITH RETROGRADE URETHROGRAM CYSTOTOMY WITH INSERTION OF SUPRAPUBIC CATHETER BLADDER REPAIR URETHRAL REPAIR OF LACERATION  Patient Location:  CT scanner  Anesthesia Type:General  Level of Consciousness: sedated and Patient remains intubated per anesthesia plan  Airway & Oxygen Therapy: Patient remains intubated per anesthesia plan and Patient placed on Ventilator (see vital sign flow sheet for setting)  Post-op Assessment: Report given to RN and Post -op Vital signs reviewed and stable  Post vital signs: Reviewed and stable  Last Vitals:  Vitals Value Taken Time  BP    Temp    Pulse    Resp    SpO2      Last Pain:  Patient transported to CT scanner with standard monitors (HR, BP, SPO2, RR) and emergency drugs/equipment. Dr. Nance Pew present for transport and Dr. Freida Busman. Controlled ventilation maintained via ambu bag. Report given to bedside RN and respiratory therapist. All questions answered and vital signs stable before leaving     Complications: No notable events documented.

## 2023-03-16 NOTE — Procedures (Signed)
   Procedure Note  Date: 03/15/2023  Procedure: central venous catheter placement--right, subclavian vein, without ultrasound guidance  Pre-op diagnosis: inadequate IV access, unable to obtain additional peripheral access Post-op diagnosis: same  Surgeon: Diamantina Monks, MD  EBL: <5cc Drains/Implants: lumen central venous catheter  Description of procedure: This procedure was performed emergently, therefore informed consent was not obtained, but and was performed under sterile conditions. The right subclavian  vein was localized using anatomic landmarks, accessed using an introducer needle, and a guidewire passed through the needle. The needle was removed and a skin nick was made. The tract was dilated and the central venous catheter advanced over the guidewire followed by removal of the guidewire. All ports drew blood easily and all were flushed with saline. The catheter was secured to the skin with suture.   Diamantina Monks, MD General and Trauma Surgery [facility]

## 2023-03-16 NOTE — TOC CAGE-AID Note (Signed)
Transition of Care [facility]) - CAGE-AID Screening   Patient Details  Name: [name] MRN: [id number] Date of Birth: 21-Sep-1996  Transition of Care [facility]) CM/SW Contact:    Katha Hamming, RN Phone Number:[phone] 03/16/2023, 8:00 PM      CAGE-AID Screening: Substance Abuse Screening unable to be completed due to: : Patient unable to participate (currently intubated)
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
## 2023-03-17 ENCOUNTER — Inpatient Hospital Stay (HOSPITAL_COMMUNITY): Payer: Self-pay | Admitting: Certified Registered Nurse Anesthetist

## 2023-03-17 ENCOUNTER — Other Ambulatory Visit: Payer: Self-pay

## 2023-03-17 ENCOUNTER — Encounter (HOSPITAL_COMMUNITY): Admission: EM | Disposition: A | Discharge status: Rehabilitation Facility | Payer: Self-pay | Source: Home / Self Care

## 2023-03-17 ENCOUNTER — Inpatient Hospital Stay (HOSPITAL_COMMUNITY): Payer: Self-pay

## 2023-03-17 DIAGNOSIS — S31501A Unspecified open wound of unspecified external genital organs, male, initial encounter: Secondary | ICD-10-CM

## 2023-03-17 DIAGNOSIS — Z182 Retained plastic fragments: Secondary | ICD-10-CM

## 2023-03-17 HISTORY — PX: INCISION AND DRAINAGE OF WOUND: SHX1803

## 2023-03-17 HISTORY — PX: LACERATION REPAIR: SHX5284

## 2023-03-17 HISTORY — PX: CLOSED REDUCTION ELBOW FRACTURE: SHX930

## 2023-03-17 HISTORY — PX: I & D EXTREMITY: SHX5045

## 2023-03-17 HISTORY — PX: EXTERNAL FIXATION PELVIS: SHX1551

## 2023-03-17 LAB — POCT I-STAT 7, (LYTES, BLD GAS, ICA,H+H)
Acid-Base Excess: 2 mmol/L (ref 0.0–2.0)
Acid-Base Excess: 0 mmol/L (ref 0.0–2.0)
Bicarbonate: 26.6 mmol/L (ref 20.0–28.0)
Bicarbonate: 24.9 mmol/L (ref 20.0–28.0)
Calcium, Ion: 1.1 mmol/L — ABNORMAL LOW (ref 1.15–1.40)
Calcium, Ion: 1.1 mmol/L — ABNORMAL LOW (ref 1.15–1.40)
HCT: 21 % — ABNORMAL LOW (ref 39.0–52.0)
HCT: 24 % — ABNORMAL LOW (ref 39.0–52.0)
Hemoglobin: 7.1 g/dL — ABNORMAL LOW (ref 13.0–17.0)
Hemoglobin: 8.2 g/dL — ABNORMAL LOW (ref 13.0–17.0)
O2 Saturation: 100 %
O2 Saturation: 98 %
Patient temperature: 35.5
Potassium: 3.5 mmol/L (ref 3.5–5.1)
Potassium: 3.7 mmol/L (ref 3.5–5.1)
Sodium: 136 mmol/L (ref 135–145)
Sodium: 138 mmol/L (ref 135–145)
TCO2: 28 mmol/L (ref 22–32)
TCO2: 26 mmol/L (ref 22–32)
pCO2 arterial: 38.7 mmHg (ref 32–48)
pCO2 arterial: 38.6 mmHg (ref 32–48)
pH, Arterial: 7.44 (ref 7.35–7.45)
pH, Arterial: 7.417 (ref 7.35–7.45)
pO2, Arterial: 180 mmHg — ABNORMAL HIGH (ref 83–108)
pO2, Arterial: 104 mmHg (ref 83–108)

## 2023-03-17 LAB — CBC
HCT: 28.6 % — ABNORMAL LOW (ref 39.0–52.0)
Hemoglobin: 9.5 g/dL — ABNORMAL LOW (ref 13.0–17.0)
MCH: 29.7 pg (ref 26.0–34.0)
MCHC: 33.2 g/dL (ref 30.0–36.0)
MCV: 89.4 fL (ref 80.0–100.0)
Platelets: 100 10*3/uL — ABNORMAL LOW (ref 150–400)
RBC: 3.2 MIL/uL — ABNORMAL LOW (ref 4.22–5.81)
RDW: 14.8 % (ref 11.5–15.5)
WBC: 9.4 10*3/uL (ref 4.0–10.5)
nRBC: 0 % (ref 0.0–0.2)

## 2023-03-17 LAB — COMPREHENSIVE METABOLIC PANEL
ALT: 158 U/L — ABNORMAL HIGH (ref 0–44)
AST: 210 U/L — ABNORMAL HIGH (ref 15–41)
Albumin: 2.7 g/dL — ABNORMAL LOW (ref 3.5–5.0)
Alkaline Phosphatase: 47 U/L (ref 38–126)
Anion gap: 15 (ref 5–15)
BUN: 16 mg/dL (ref 6–20)
CO2: 25 mmol/L (ref 22–32)
Calcium: 7.8 mg/dL — ABNORMAL LOW (ref 8.9–10.3)
Chloride: 97 mmol/L — ABNORMAL LOW (ref 98–111)
Creatinine, Ser: 0.77 mg/dL (ref 0.61–1.24)
GFR, Estimated: >60 mL/min (ref >60)
Glucose, Bld: 131 mg/dL — ABNORMAL HIGH (ref 70–99)
Potassium: 3.6 mmol/L (ref 3.5–5.1)
Sodium: 137 mmol/L (ref 135–145)
Total Bilirubin: 1.1 mg/dL (ref 0.3–1.2)
Total Protein: 5 g/dL — ABNORMAL LOW (ref 6.5–8.1)

## 2023-03-17 LAB — PREPARE RBC (CROSSMATCH)

## 2023-03-17 LAB — TYPE AND SCREEN
Unit division: 0
Unit division: 0
Unit division: 0

## 2023-03-17 LAB — BPAM RBC
Blood Product Expiration Date: 202407122359
Blood Product Expiration Date: 202407142359
Blood Product Expiration Date: 202407152359
Blood Product Expiration Date: 202407232359
Unit Type and Rh: 5100
Unit Type and Rh: 5100
Unit Type and Rh: 5100
Unit Type and Rh: 5100
Unit Type and Rh: 5100
Unit Type and Rh: 5100
Unit Type and Rh: 5100

## 2023-03-17 LAB — TRIGLYCERIDES: Triglycerides: 501 mg/dL — ABNORMAL HIGH (ref <150)

## 2023-03-17 SURGERY — CLOSED REDUCTION, ELBOW
Anesthesia: General | Site: Leg Lower | Laterality: Right

## 2023-03-17 MED ORDER — LACTATED RINGERS IV BOLUS
1000.0000 mL | Freq: Once | INTRAVENOUS | Status: AC
  Administered 2023-03-17: 1000 mL via INTRAVENOUS

## 2023-03-17 MED ORDER — SODIUM CHLORIDE 0.9% IV SOLUTION: Freq: Once | INTRAVENOUS | Status: DC

## 2023-03-17 MED ORDER — PROPOFOL 10 MG/ML IV BOLUS
INTRAVENOUS | Status: DC | PRN
  Administered 2023-03-17: 20 mg via INTRAVENOUS

## 2023-03-17 MED ORDER — FENTANYL CITRATE (PF) 250 MCG/5ML IJ SOLN
INTRAMUSCULAR | Status: AC
  Filled 2023-03-17: qty 5

## 2023-03-17 MED ORDER — FENTANYL CITRATE (PF) 250 MCG/5ML IJ SOLN
INTRAMUSCULAR | Status: DC | PRN
  Administered 2023-03-17 (×5): 50 ug via INTRAVENOUS

## 2023-03-17 MED ORDER — ALBUMIN HUMAN 25 % IV SOLN
INTRAVENOUS | Status: AC
  Filled 2023-03-17: qty 50

## 2023-03-17 MED ORDER — MIDAZOLAM HCL 2 MG/2ML IJ SOLN
INTRAMUSCULAR | Status: DC | PRN
  Administered 2023-03-17: 2 mg via INTRAVENOUS

## 2023-03-17 MED ORDER — ALBUMIN HUMAN 5 % IV SOLN
12.5000 g | Freq: Once | INTRAVENOUS | Status: AC
  Administered 2023-03-17: 12.5 g via INTRAVENOUS
  Filled 2023-03-17: qty 250

## 2023-03-17 MED ORDER — DEXMEDETOMIDINE HCL IN NACL 400 MCG/100ML IV SOLN
0.0000 ug/kg/h | INTRAVENOUS | Status: DC
  Administered 2023-03-17: 0.4 ug/kg/h via INTRAVENOUS
  Administered 2023-03-17: 0.7 ug/kg/h via INTRAVENOUS
  Administered 2023-03-17 (×2): 1 ug/kg/h via INTRAVENOUS
  Administered 2023-03-17: .7 ug/kg/h via INTRAVENOUS
  Administered 2023-03-18: 1 ug/kg/h via INTRAVENOUS
  Filled 2023-03-17 (×5): qty 100

## 2023-03-17 MED ORDER — LACTATED RINGERS IV SOLN: INTRAVENOUS | Status: DC | PRN

## 2023-03-17 MED ORDER — MIDAZOLAM HCL 2 MG/2ML IJ SOLN
INTRAMUSCULAR | Status: AC
  Filled 2023-03-17: qty 2

## 2023-03-17 MED ORDER — ONDANSETRON HCL 4 MG/2ML IJ SOLN
INTRAMUSCULAR | Status: DC | PRN
  Administered 2023-03-17: 4 mg via INTRAVENOUS

## 2023-03-17 MED ORDER — 0.9 % SODIUM CHLORIDE (POUR BTL) OPTIME
TOPICAL | Status: DC | PRN
  Administered 2023-03-17 (×3): 1000 mL

## 2023-03-17 MED ORDER — ONDANSETRON HCL 4 MG/2ML IJ SOLN
INTRAMUSCULAR | Status: AC
  Filled 2023-03-17: qty 2

## 2023-03-17 MED ORDER — DEXAMETHASONE SODIUM PHOSPHATE 10 MG/ML IJ SOLN
INTRAMUSCULAR | Status: DC | PRN
  Administered 2023-03-17: 5 mg via INTRAVENOUS

## 2023-03-17 MED ORDER — ALBUMIN HUMAN 5 % IV SOLN: INTRAVENOUS | Status: DC | PRN

## 2023-03-17 MED ORDER — CHLORHEXIDINE GLUCONATE CLOTH 2 % EX PADS
6.0000 | MEDICATED_PAD | Freq: Once | CUTANEOUS | Status: AC
  Administered 2023-03-17: 6 via TOPICAL

## 2023-03-17 MED ORDER — ROCURONIUM BROMIDE 10 MG/ML (PF) SYRINGE
PREFILLED_SYRINGE | INTRAVENOUS | Status: DC | PRN
  Administered 2023-03-17: 50 mg via INTRAVENOUS
  Administered 2023-03-17: 40 mg via INTRAVENOUS
  Administered 2023-03-17: 20 mg via INTRAVENOUS
  Administered 2023-03-17: 10 mg via INTRAVENOUS
  Administered 2023-03-17: 60 mg via INTRAVENOUS

## 2023-03-17 MED ORDER — DEXAMETHASONE SODIUM PHOSPHATE 10 MG/ML IJ SOLN
INTRAMUSCULAR | Status: AC
  Filled 2023-03-17: qty 1

## 2023-03-17 MED ORDER — ROCURONIUM BROMIDE 10 MG/ML (PF) SYRINGE
PREFILLED_SYRINGE | INTRAVENOUS | Status: AC
  Filled 2023-03-17: qty 10

## 2023-03-17 MED ORDER — SODIUM CHLORIDE 0.9 % IV SOLN: INTRAVENOUS | Status: DC | PRN

## 2023-03-17 SURGICAL SUPPLY — 113 items
BAG COUNTER SPONGE SURGICOUNT (BAG) ×10 IMPLANT
BAG SPNG CNTER NS LX DISP (BAG) ×10
BANDAGE ESMARK 6X9 LF (GAUZE/BANDAGES/DRESSINGS) ×5 IMPLANT
BAR EXFIX CVD 11X380 (EXFIX) IMPLANT
BAR EXFX 380 NS LF DISP (EXFIX) ×5
BIT DRILL 4.8X300 (BIT) IMPLANT
BLADE SURG 10 STRL SS (BLADE) ×5 IMPLANT
BLADE SURG 15 STRL LF DISP TIS (BLADE) ×5 IMPLANT
BLADE SURG 15 STRL SS (BLADE) ×5
BNDG CMPR 5X3 KNIT ELC UNQ LF (GAUZE/BANDAGES/DRESSINGS) ×10
BNDG CMPR 5X4 KNIT ELC UNQ LF (GAUZE/BANDAGES/DRESSINGS) ×5
BNDG CMPR 5X6 CHSV STRCH STRL (GAUZE/BANDAGES/DRESSINGS)
BNDG CMPR 9X6 STRL LF SNTH (GAUZE/BANDAGES/DRESSINGS)
BNDG CMPR MED 15X6 ELC VLCR LF (GAUZE/BANDAGES/DRESSINGS) ×5
BNDG COHESIVE 4X5 TAN STRL (GAUZE/BANDAGES/DRESSINGS) ×5 IMPLANT
BNDG COHESIVE 6X5 TAN ST LF (GAUZE/BANDAGES/DRESSINGS) IMPLANT
BNDG ELASTIC 3INX 5YD STR LF (GAUZE/BANDAGES/DRESSINGS) IMPLANT
BNDG ELASTIC 4INX 5YD STR LF (GAUZE/BANDAGES/DRESSINGS) IMPLANT
BNDG ELASTIC 4X5.8 VLCR STR LF (GAUZE/BANDAGES/DRESSINGS) ×5 IMPLANT
BNDG ELASTIC 6X15 VLCR STRL LF (GAUZE/BANDAGES/DRESSINGS) IMPLANT
BNDG ELASTIC 6X5.8 VLCR STR LF (GAUZE/BANDAGES/DRESSINGS) ×5 IMPLANT
BNDG ESMARK 6X9 LF (GAUZE/BANDAGES/DRESSINGS)
BNDG GAUZE DERMACEA FLUFF 4 (GAUZE/BANDAGES/DRESSINGS) ×10 IMPLANT
BNDG GZE DERMACEA 4 6PLY (GAUZE/BANDAGES/DRESSINGS) ×15
BRUSH SCRUB EZ PLAIN DRY (MISCELLANEOUS) ×10 IMPLANT
CANISTER SUCT 3000ML PPV (MISCELLANEOUS) ×5 IMPLANT
CLAMP BLUE BAR TO PIN (EXFIX) IMPLANT
COVER SURGICAL LIGHT HANDLE (MISCELLANEOUS) ×15 IMPLANT
CUFF TOURN SGL QUICK 18X4 (TOURNIQUET CUFF) IMPLANT
DRAPE C-ARM 42X72 X-RAY (DRAPES) ×5 IMPLANT
DRAPE C-ARMOR (DRAPES) ×5 IMPLANT
DRAPE HALF SHEET 40X57 (DRAPES) IMPLANT
DRAPE INCISE IOBAN 66X45 STRL (DRAPES) ×5 IMPLANT
DRAPE LAPAROSCOPIC ABDOMINAL (DRAPES) IMPLANT
DRAPE LAPAROTOMY 100X72 PEDS (DRAPES) IMPLANT
DRAPE LAPAROTOMY TRNSV 102X78 (DRAPES) ×5 IMPLANT
DRAPE ORTHO SPLIT 77X108 STRL (DRAPES) ×10
DRAPE SURG ORHT 6 SPLT 77X108 (DRAPES) IMPLANT
DRAPE U-SHAPE 47X51 STRL (DRAPES) ×5 IMPLANT
DRESSING MEPILEX FLEX 4X4 (GAUZE/BANDAGES/DRESSINGS) IMPLANT
DRSG ADAPTIC 3X8 NADH LF (GAUZE/BANDAGES/DRESSINGS) ×5 IMPLANT
DRSG MEPILEX FLEX 4X4 (GAUZE/BANDAGES/DRESSINGS) ×15
DRSG MEPITEL 4X7.2 (GAUZE/BANDAGES/DRESSINGS) ×5 IMPLANT
DRSG MEPITEL 8X12 (GAUZE/BANDAGES/DRESSINGS) IMPLANT
ELECT REM PT RETURN 9FT ADLT (ELECTROSURGICAL) ×10
ELECTRODE REM PT RTRN 9FT ADLT (ELECTROSURGICAL) ×10 IMPLANT
EVACUATOR 1/8 PVC DRAIN (DRAIN) IMPLANT
GAUZE PAD ABD 8X10 STRL (GAUZE/BANDAGES/DRESSINGS) ×10 IMPLANT
GAUZE SPONGE 4X4 12PLY STRL (GAUZE/BANDAGES/DRESSINGS) ×5 IMPLANT
GAUZE SPONGE 4X4 12PLY STRL LF (GAUZE/BANDAGES/DRESSINGS) IMPLANT
GAUZE XEROFORM 5X9 LF (GAUZE/BANDAGES/DRESSINGS) ×5 IMPLANT
GLOVE BIO SURGEON STRL SZ 6.5 (GLOVE) ×5 IMPLANT
GLOVE BIO SURGEON STRL SZ7.5 (GLOVE) ×5 IMPLANT
GLOVE BIO SURGEON STRL SZ8 (GLOVE) ×5 IMPLANT
GLOVE BIO SURGEON STRL SZ8.5 (GLOVE) ×5 IMPLANT
GLOVE BIOGEL PI IND STRL 6 (GLOVE) ×5 IMPLANT
GLOVE BIOGEL PI IND STRL 7.5 (GLOVE) ×5 IMPLANT
GLOVE BIOGEL PI IND STRL 8 (GLOVE) ×5 IMPLANT
GLOVE BIOGEL PI IND STRL 9 (GLOVE) ×5 IMPLANT
GLOVE SURG ORTHO LTX SZ7.5 (GLOVE) ×10 IMPLANT
GOWN STRL REUS W/ TWL LRG LVL3 (GOWN DISPOSABLE) ×20 IMPLANT
GOWN STRL REUS W/ TWL XL LVL3 (GOWN DISPOSABLE) ×5 IMPLANT
GOWN STRL REUS W/TWL LRG LVL3 (GOWN DISPOSABLE) ×20
GOWN STRL REUS W/TWL XL LVL3 (GOWN DISPOSABLE) ×15
HANDPIECE INTERPULSE COAX TIP (DISPOSABLE)
KIT BASIN OR (CUSTOM PROCEDURE TRAY) ×10 IMPLANT
KIT TURNOVER KIT B (KITS) ×10 IMPLANT
MANIFOLD NEPTUNE II (INSTRUMENTS) ×5 IMPLANT
NDL 22X1.5 STRL (OR ONLY) (MISCELLANEOUS) IMPLANT
NEEDLE 22X1.5 STRL (OR ONLY) (MISCELLANEOUS) IMPLANT
NS IRRIG 1000ML POUR BTL (IV SOLUTION) ×10 IMPLANT
PACK GENERAL/GYN (CUSTOM PROCEDURE TRAY) ×10 IMPLANT
PACK ORTHO EXTREMITY (CUSTOM PROCEDURE TRAY) ×5 IMPLANT
PAD ARMBOARD 7.5X6 YLW CONV (MISCELLANEOUS) ×15 IMPLANT
PAD CAST 4YDX4 CTTN HI CHSV (CAST SUPPLIES) ×5 IMPLANT
PADDING CAST COTTON 4X4 STRL (CAST SUPPLIES) ×10
PADDING CAST COTTON 6X4 STRL (CAST SUPPLIES) ×10 IMPLANT
PENCIL SMOKE EVACUATOR (MISCELLANEOUS) ×5 IMPLANT
PILLOW ABDUCTION MEDIUM (MISCELLANEOUS) IMPLANT
PIN GUIDE DRIL TIP 2.8X300 STE (PIN) IMPLANT
PIN HALF 5X200X85MM EXFIX (EXFIX) IMPLANT
SCREW CANN 8.0X130X40 (Screw) IMPLANT
SCREW CANN 8.0X95 HIP (Screw) IMPLANT
SCREW CANN THRD 8X155X40 (Screw) IMPLANT
SET HNDPC FAN SPRY TIP SCT (DISPOSABLE) IMPLANT
SOL PREP POV-IOD 4OZ 10% (MISCELLANEOUS) ×5 IMPLANT
SOL SCRUB PVP POV-IOD 4OZ 7.5% (MISCELLANEOUS) ×15
SOLUTION SCRB POV-IOD 4OZ 7.5% (MISCELLANEOUS) ×5 IMPLANT
SPLINT FIBERGLASS 3X35 (CAST SUPPLIES) IMPLANT
SPLINT FIBERGLASS 4X30 (CAST SUPPLIES) IMPLANT
SPONGE T-LAP 18X18 ~~LOC~~+RFID (SPONGE) ×5 IMPLANT
STAPLER VISISTAT 35W (STAPLE) ×5 IMPLANT
STOCKINETTE IMPERVIOUS 9X36 MD (GAUZE/BANDAGES/DRESSINGS) IMPLANT
STOCKINETTE IMPERVIOUS LG (DRAPES) IMPLANT
STRIP CLOSURE SKIN 1/2X4 (GAUZE/BANDAGES/DRESSINGS) IMPLANT
SUT ETHILON 2 0 FS 18 (SUTURE) ×10 IMPLANT
SUT ETHILON 2 0 PSLX (SUTURE) IMPLANT
SUT PDS AB 0 CT1 27 (SUTURE) IMPLANT
SUT PDS AB 2-0 CT1 27 (SUTURE) IMPLANT
SUT VIC AB 0 CT1 27 (SUTURE)
SUT VIC AB 0 CT1 27XBRD ANBCTR (SUTURE) IMPLANT
SUT VIC AB 2-0 CT1 27 (SUTURE) ×5
SUT VIC AB 2-0 CT1 TAPERPNT 27 (SUTURE) ×5 IMPLANT
SUT VIC AB 2-0 FS1 27 (SUTURE) ×5 IMPLANT
SWAB COLLECTION DEVICE MRSA (MISCELLANEOUS) IMPLANT
SWAB CULTURE ESWAB REG 1ML (MISCELLANEOUS) IMPLANT
TOWEL GREEN STERILE (TOWEL DISPOSABLE) ×15 IMPLANT
TOWEL GREEN STERILE FF (TOWEL DISPOSABLE) ×15 IMPLANT
TUBE CONNECTING 12X1/4 (SUCTIONS) ×5 IMPLANT
UNDERPAD 30X36 HEAVY ABSORB (UNDERPADS AND DIAPERS) ×5 IMPLANT
WASHER CANN FLAT 8 (Washer) IMPLANT
WATER STERILE IRR 1000ML POUR (IV SOLUTION) ×5 IMPLANT
YANKAUER SUCT BULB TIP NO VENT (SUCTIONS) ×5 IMPLANT

## 2023-03-17 NOTE — Progress Notes (Signed)
   03/17/23 0127  Provider Notification  Provider Name/Title Sophronia Simas, MD  Date Provider Notified 03/17/23  Time Provider Notified 0127  Method of Notification Page  Notification Reason Critical Result (Triglycerides)  Test performed and critical result Triglycerides - 501  Date Critical Result Received 03/17/23  Time Critical Result Received 0125  Provider response Evaluate remotely  Date of Provider Response 03/17/23  Time of Provider Response 0206     Latest Reference Range & Units Most Recent 03/16/23 23:45  Triglycerides <150 mg/dL 213 (H) 0/86/57 84:69 629 (H)  (H): Data is abnormally high   MD notified of critical result. Titrated down on propofol per MD recommendation and was able to turn it off. Began precedex per MD order. During the transition from propofol to precedex, the pt became agitated and restless and I gave the pt two fentanyl boluses. The pt is now calm and resting with fentanyl and precedex running.
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
## 2023-03-17 NOTE — Progress Notes (Signed)
Spoke with MD regarding pt's blood pressure continuing to be soft. PRN bolus ordered to support blood pressure.
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
## 2023-03-17 NOTE — Transfer of Care (Signed)
Immediate Anesthesia Transfer of Care Note  Patient: [name]  Procedure(s) Performed: CLOSED REDUCTION ELBOW (Right: Elbow) EXTERNAL FIXATION PELVIS (Left) IRRIGATION AND DEBRIDEMENT BILATERAL LOWER EXTREMITIES (Bilateral: Leg Lower) REPAIR MULTIPLE LACERATIONS IRRIGATION AND DEBRIDEMENT PERINEAL  Patient Location: ICU  Anesthesia Type:General  Level of Consciousness: sedated, unresponsive, and Patient remains intubated per anesthesia plan  Airway & Oxygen Therapy: Patient remains intubated per anesthesia plan and Patient placed on Ventilator (see vital sign flow sheet for setting)  Post-op Assessment: Report given to RN and Post -op Vital signs reviewed and stable  Post vital signs: Reviewed and stable  Last Vitals:  Vitals Value Taken Time  BP    Temp 34.9 C 03/17/23 2024  Pulse 59 03/17/23 2024  Resp 17 03/17/23 2024  SpO2 97 % 03/17/23 2024  Vitals shown include unvalidated device data.  Last Pain:  Vitals:   03/17/23 1548  TempSrc: Oral  PainSc:       Patients Stated Pain Goal: 0 (03/17/23 1119)  Complications: No notable events documented.
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
## 2023-03-17 NOTE — Progress Notes (Signed)
Pt. Transported to OR via ICU bed with anesthesia.  Family to waiting room.

## 2023-03-17 NOTE — Plan of Care (Signed)
  Problem: Clinical Measurements: Goal: Ability to maintain clinical measurements within normal limits will improve Outcome: Progressing Goal: Diagnostic test results will improve Outcome: Progressing Goal: Cardiovascular complication will be avoided Outcome: Progressing   Problem: Nutrition: Goal: Adequate nutrition will be maintained Outcome: Progressing   Problem: Coping: Goal: Level of anxiety will decrease Outcome: Progressing   Problem: Elimination: Goal: Will not experience complications related to urinary retention Outcome: Progressing
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
## 2023-03-17 NOTE — Anesthesia Postprocedure Evaluation (Signed)
Anesthesia Post Note  Patient: Kashius Calvey  Procedure(s) Performed: CLOSED REDUCTION ELBOW (Right: Elbow) EXTERNAL FIXATION PELVIS (Left) IRRIGATION AND DEBRIDEMENT BILATERAL LOWER EXTREMITIES (Bilateral: Leg Lower) REPAIR MULTIPLE LACERATIONS IRRIGATION AND DEBRIDEMENT PERINEAL     Patient location during evaluation: ICU Anesthesia Type: General Level of consciousness: sedated and patient remains intubated per anesthesia plan Pain management: pain level controlled Vital Signs Assessment: post-procedure vital signs reviewed and stable Respiratory status: patient on ventilator - see flowsheet for VS Cardiovascular status: stable Anesthetic complications: no   No notable events documented.  Last Vitals:  Vitals:   03/17/23 1548 03/17/23 2025  BP:    Pulse:  (!) 58  Resp:  15  Temp: 36.6 C (!) 35 C  SpO2:  100%    Last Pain:  Vitals:   03/17/23 2025  TempSrc: Oral  PainSc:                  Lewie Loron

## 2023-03-18 ENCOUNTER — Inpatient Hospital Stay (HOSPITAL_COMMUNITY): Payer: Self-pay

## 2023-03-18 LAB — BASIC METABOLIC PANEL
Anion gap: 8 (ref 5–15)
BUN: 12 mg/dL (ref 6–20)
CO2: 24 mmol/L (ref 22–32)
Calcium: 7.8 mg/dL — ABNORMAL LOW (ref 8.9–10.3)
Chloride: 103 mmol/L (ref 98–111)
Creatinine, Ser: 0.68 mg/dL (ref 0.61–1.24)
GFR, Estimated: >60 mL/min (ref >60)
Glucose, Bld: 149 mg/dL — ABNORMAL HIGH (ref 70–99)
Potassium: 4.1 mmol/L (ref 3.5–5.1)
Sodium: 135 mmol/L (ref 135–145)

## 2023-03-18 LAB — CBC
HCT: 25.8 % — ABNORMAL LOW (ref 39.0–52.0)
Hemoglobin: 8.7 g/dL — ABNORMAL LOW (ref 13.0–17.0)
MCH: 30.3 pg (ref 26.0–34.0)
MCHC: 33.7 g/dL (ref 30.0–36.0)
MCV: 89.9 fL (ref 80.0–100.0)
Platelets: 95 10*3/uL — ABNORMAL LOW (ref 150–400)
RBC: 2.87 MIL/uL — ABNORMAL LOW (ref 4.22–5.81)
RDW: 13.7 % (ref 11.5–15.5)
WBC: 8.7 10*3/uL (ref 4.0–10.5)
nRBC: 0 % (ref 0.0–0.2)

## 2023-03-18 LAB — TRIGLYCERIDES: Triglycerides: 360 mg/dL — ABNORMAL HIGH (ref <150)

## 2023-03-18 LAB — BPAM RBC
Blood Product Expiration Date: 202407122359
Blood Product Expiration Date: 202407142359
Blood Product Expiration Date: 202407152359
Blood Product Expiration Date: 202407202359
Blood Product Expiration Date: 202407202359
ISSUE DATE / TIME: 202406151942
ISSUE DATE / TIME: 202406151942
ISSUE DATE / TIME: 202406151942
ISSUE DATE / TIME: 202406161141
ISSUE DATE / TIME: 202406171206
ISSUE DATE / TIME: 202406171303
Unit Type and Rh: 5100
Unit Type and Rh: 5100
Unit Type and Rh: 5100
Unit Type and Rh: 5100
Unit Type and Rh: 5100

## 2023-03-18 LAB — TYPE AND SCREEN
Antibody Screen: NEGATIVE
Unit division: 0
Unit division: 0
Unit division: 0
Unit division: 0
Unit division: 0
Unit division: 0

## 2023-03-18 MED ORDER — HYDROMORPHONE HCL 1 MG/ML IJ SOLN
0.5000 mg | Freq: Two times a day (BID) | INTRAMUSCULAR | Status: DC
  Administered 2023-03-18 (×2): 0.5 mg via INTRAVENOUS
  Filled 2023-03-18 (×3): qty 1

## 2023-03-18 MED ORDER — MORPHINE SULFATE (PF) 2 MG/ML IV SOLN
2.0000 mg | INTRAVENOUS | Status: DC | PRN
  Administered 2023-03-18: 2 mg via INTRAVENOUS
  Administered 2023-03-18: 4 mg via INTRAVENOUS
  Administered 2023-03-18: 2 mg via INTRAVENOUS
  Administered 2023-03-18: 4 mg via INTRAVENOUS
  Administered 2023-03-18 (×2): 2 mg via INTRAVENOUS
  Administered 2023-03-19: 4 mg via INTRAVENOUS
  Administered 2023-03-19: 2 mg via INTRAVENOUS
  Administered 2023-03-19 – 2023-03-20 (×4): 4 mg via INTRAVENOUS
  Administered 2023-03-20 (×3): 2 mg via INTRAVENOUS
  Administered 2023-03-21: 3 mg via INTRAVENOUS
  Filled 2023-03-18 (×3): qty 2
  Filled 2023-03-18 (×2): qty 1
  Filled 2023-03-18 (×2): qty 2
  Filled 2023-03-18: qty 1
  Filled 2023-03-18 (×3): qty 2
  Filled 2023-03-18 (×2): qty 1
  Filled 2023-03-18: qty 2
  Filled 2023-03-18: qty 1

## 2023-03-18 MED ORDER — ENOXAPARIN SODIUM 30 MG/0.3ML IJ SOSY
30.0000 mg | PREFILLED_SYRINGE | Freq: Two times a day (BID) | INTRAMUSCULAR | Status: DC
  Administered 2023-03-19 – 2023-03-28 (×18): 30 mg via SUBCUTANEOUS
  Filled 2023-03-18 (×18): qty 0.3

## 2023-03-18 MED ORDER — CHLORHEXIDINE GLUCONATE CLOTH 2 % EX PADS
6.0000 | MEDICATED_PAD | Freq: Once | CUTANEOUS | Status: AC
  Administered 2023-03-19: 6 via TOPICAL

## 2023-03-18 MED ORDER — SODIUM CHLORIDE 0.9 % IV SOLN
2.0000 g | INTRAVENOUS | Status: AC
  Administered 2023-03-18 – 2023-03-20 (×3): 2 g via INTRAVENOUS
  Filled 2023-03-18 (×3): qty 20

## 2023-03-18 MED ORDER — METRONIDAZOLE 500 MG/100ML IV SOLN
500.0000 mg | Freq: Three times a day (TID) | INTRAVENOUS | Status: AC
  Administered 2023-03-18 – 2023-03-21 (×8): 500 mg via INTRAVENOUS
  Filled 2023-03-18 (×8): qty 100

## 2023-03-18 NOTE — Progress Notes (Signed)
1 Day Post-Op Subjective: Awake and conversational. Adequate UOP per SPT; urine is amber in color. Had long conversation with patient and family member about his Urologic injuries and future course re: definitive repair  Objective: Vital signs in last 24 hours: Temp:  [95 F (35 C)-99.3 F (37.4 C)] 98 F (36.7 C) (06/18 1155) Pulse Rate:  [57-113] 101 (06/18 1400) Resp:  [15-33] 29 (06/18 1400) BP: (110-129)/(60-80) 120/72 (06/18 1400) SpO2:  [89 %-100 %] 95 % (06/18 1400) Arterial Line BP: (130-176)/(53-104) 165/70 (06/18 1400) FiO2 (%):  [40 %] 40 % (06/18 0807)  Intake/Output from previous day: 06/17 0701 - 06/18 0700 In: 3951.8 [I.V.:2753.7; Blood:318; NG/GT:60; IV Piggyback:820.1] Out: 1610 [Urine:1410; Blood:50] Intake/Output this shift: Total I/O In: 652.4 [I.V.:452.4; IV Piggyback:200] Out: 125 [Urine:125]  Physical Exam:  General: alert, oriented CV: sinus tachycardia on tele Lungs: normal work of breathing Abdomen: Soft, ND. SPT site with dressing in place. Incision c/d/I.  GU: SPT draining amber urine. Perineal lac open and packed.  Lab Results: Recent Labs    03/17/23 1639 03/17/23 1818 03/18/23 0323  HGB 7.1* 8.2* 8.7*  HCT 21.0* 24.0* 25.8*    BMET Recent Labs    03/17/23 0419 03/17/23 1639 03/17/23 1818 03/18/23 0323  NA 137   < > 138 135  K 3.6   < > 3.7 4.1  CL 97*  --   --  103  CO2 25  --   --  24  GLUCOSE 131*  --   --  149*  BUN 16  --   --  12  CREATININE 0.77  --   --  0.68  CALCIUM 7.8*  --   --  7.8*   < > = values in this interval not displayed.      Studies/Results: DG Elbow 2 Views Right  Result Date: 03/18/2023 CLINICAL DATA:  Reduction of elbow dislocation. EXAM: RIGHT ELBOW - 2 VIEW COMPARISON:  Prior radiograph 03/16/2023 FINDINGS: Reduction elbow dislocation.  No definite fractures. IMPRESSION: Reduction of elbow dislocation.  No definite fractures. Electronically Signed   By: Rudie Meyer M.D.   On: 03/18/2023 14:03    DG Pelvis Comp Min 3V  Result Date: 03/18/2023 CLINICAL DATA:  Fixation of pelvic fractures. EXAM: JUDET PELVIS - 3+ VIEW COMPARISON:  Prior radiograph 03/15/2023 and CT scan 03/16/2023. FINDINGS: There are 2 long cannulated screws transfixing the SI joints which demonstrate anatomic reduction. There also smooth pins in both iliac bones with associated pelvic external fixator. Both hips are normally located. Complex comminuted pelvic fractures are again demonstrated with disruption of the pubic symphysis. IMPRESSION: 1. Internal fixation of the SI joints with anatomic reduction. 2. Pelvic external fixator in place. 3. Complex comminuted pelvic fractures. Electronically Signed   By: Rudie Meyer M.D.   On: 03/18/2023 13:59   CT ELBOW RIGHT WO CONTRAST  Result Date: 03/18/2023 CLINICAL DATA:  Elbow dislocation. EXAM: CT OF THE UPPER RIGHT EXTREMITY WITHOUT CONTRAST TECHNIQUE: Multidetector CT imaging of the upper right extremity was performed according to the standard protocol. RADIATION DOSE REDUCTION: This exam was performed according to the departmental dose-optimization program which includes automated exposure control, adjustment of the mA and/or kV according to patient size and/or use of iterative reconstruction technique. COMPARISON:  Radiographs 03/17/2023 FINDINGS: Bones/Joint/Cartilage The elbow dislocation has been reduced. I do not see any definite fractures involving the humerus, radius or ulna but there is a small bone fragment noted in the joint which could be a small osteochondral  fragment or small avulsion fracture without obvious donor site. Expected elbow joint effusion. Associated moderate surrounding subcutaneous soft tissue swelling/edema/hematoma. IMPRESSION: 1. Interval reduction of the elbow dislocation. 2. Small bone fragment in the joint could be a small osteochondral fragment or small avulsion fracture without obvious donor site. 3. Expected elbow joint effusion and moderate  surrounding subcutaneous soft tissue swelling/edema/hematoma. Electronically Signed   By: Rudie Meyer M.D.   On: 03/18/2023 13:55   DG Elbow 2 Views Right  Result Date: 03/17/2023 CLINICAL DATA:  Known elbow dislocation EXAM: RIGHT ELBOW - 2 VIEW COMPARISON:  11/15/2022 FLUOROSCOPY TIME:  Radiation Exposure Index (as provided by the fluoroscopic device): 0.2 mGy If the device does not provide the exposure index: Fluoroscopy Time:  6 seconds Number of Acquired Images:  1 FINDINGS: Closed reduction of the elbow dislocation was performed. Single spot film demonstrates anatomic alignment at the elbow. IMPRESSION: Status post closed reduction of elbow dislocation. Electronically Signed   By: Alcide Clever M.D.   On: 03/17/2023 21:11   DG Pelvis Comp Min 3V  Result Date: 03/17/2023 CLINICAL DATA:  External pelvic fixator EXAM: JUDET PELVIS - 3+ VIEW COMPARISON:  03/16/2019 FLUOROSCOPY TIME:  Radiation Exposure Index (as provided by the fluoroscopic device): 94.89 mGy If the device does not provide the exposure index: Fluoroscopy Time:  2 minutes 46 seconds Number of Acquired Images:  15 FINDINGS: Initial images again demonstrate previously placed external fixator. Additional fixation devices were subsequently placed. Persistent radiopaque gauze is noted packed in peroneal lacerations. The right sacroiliac joint was then traversed with 2 fixation screws. The sacroiliac joint is reduced. Persistent pelvic fractures anteriorly are noted. IMPRESSION: Fixation of the sacroiliac diastasis. Persistent pelvic fractures are noted. Fixator placement is noted. Electronically Signed   By: Alcide Clever M.D.   On: 03/17/2023 21:10   DG C-Arm 1-60 Min-No Report  Result Date: 03/17/2023 Fluoroscopy was utilized by the requesting physician.  No radiographic interpretation.   DG C-Arm 1-60 Min-No Report  Result Date: 03/17/2023 Fluoroscopy was utilized by the requesting physician.  No radiographic interpretation.   DG  C-Arm 1-60 Min-No Report  Result Date: 03/17/2023 Fluoroscopy was utilized by the requesting physician.  No radiographic interpretation.    Assessment/Plan: #Urethral injury - Significant urethral injury, likely full disruption, just proximal to the bulbar urethra - Diverted with SPT - Will require delayed definitive repair with urinary diversion via SPT in the interim. Will place referral to [facility] for evaluation with Dr. Guy Sandifer (recon)   #Bladder injury - S/p open repair - As above, SPT in place - Continue SPT to drainage. Gently irrigate with 50cc sterile saline via 50cc catheter-tipped syringe PRN for sluggish drainage - Will require monthly SPT exchanges until urethral repair   #Perineal and scrotal laceration - Wound left open and packed with quickclot - Urology available for future explorations/wash-outs in coordination with General Surgery   LOS: 3 days   [name] 03/18/2023, 2:06 PM
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
## 2023-03-18 NOTE — Anesthesia Postprocedure Evaluation (Signed)
Anesthesia Post Note  Patient: [name]  Procedure(s) Performed: IRRIGATION AND DEBRIDEMENT EXTREMITY CLOSED REDUCTION HIP (Left: Hip) IRRIGATION AND DEBRIDEMENT EXTREMITY (Left) LACERATION REPAIR EXTERNAL FIXATION PELVIS EXTERNAL FIXATION LEG (Left) CLOSED REDUCTION SPLINT APPLICATION RIGHT ARM CYSTOSCOPY WITH RETROGRADE URETHROGRAM CYSTOTOMY WITH INSERTION OF SUPRAPUBIC CATHETER BLADDER REPAIR URETHRAL REPAIR OF LACERATION     Patient location during evaluation: SICU Anesthesia Type: General Level of consciousness: sedated Pain management: pain level controlled Vital Signs Assessment: post-procedure vital signs reviewed and stable Respiratory status: patient remains intubated per anesthesia plan Cardiovascular status: stable Postop Assessment: no apparent nausea or vomiting Anesthetic complications: no   No notable events documented.  Last Vitals:  Vitals:   03/18/23 0700 03/18/23 0800  BP: 114/69   Pulse: 65 63  Resp: 19 19  Temp: 36.9 C 36.8 C  SpO2: 98% 99%    Last Pain:  Vitals:   03/18/23 0800  TempSrc: Esophageal  PainSc:                  [name]
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
## 2023-03-18 NOTE — Progress Notes (Signed)
After 5pm and on the weekends please log on to Amion, go to orthopaedics and the look under the [facility] Call for the provider(s) on call. You can also call our office at [phone] and then follow the prompts to be connected to the call team.  Patient ID: [name], male   DOB: 07-28-1996, 27 y.o.   MRN: [id number]  Orthopaedic Trauma Service Progress Note  Patient ID: [name] MRN: [id number] DOB/AGE: 05/04/1996 27 y.o.  Subjective:  Pt extubated this am  Given magnitude of injuries he looks very good  Family at bedside    ROS As above  Objective:   VITALS:   Vitals:   03/18/23 0500 03/18/23 0600 03/18/23 0700 03/18/23 0800  BP: 122/71 116/69 114/69   Pulse: 75 64 65 63  Resp: (!) 22 18 19 19   Temp: 98.4 F (36.9 C) 98.8 F (37.1 C) 98.4 F (36.9 C) 98.2 F (36.8 C)  TempSrc:    Esophageal  SpO2: 96% 97% 98% 99%  Weight:      Height:        Estimated body mass index is 31.44 kg/m as calculated from the following:   Height as of this encounter: 5\' 5"  (1.651 m).   Weight as of this encounter: 85.7 kg.   Intake/Output      06/17 0701 06/18 0700 06/18 0701 06/19 0700   I.V. (mL/kg) 2753.7 (32.1) 107.3 (1.3)   Blood 318    NG/GT 60    IV Piggyback 820.1    Total Intake(mL/kg) 3951.8 (46.1) 107.3 (1.3)   Urine (mL/kg/hr) 1410 (0.7)    Other 150    Blood 50    Total Output 1610    Net +2341.8 +107.3          LABS  Results for orders placed or performed during the hospital encounter of 03/15/23 (from the past 24 hour(s))  I-STAT 7, (LYTES, BLD GAS, ICA, H+H)     Status: Abnormal   Collection Time: 03/17/23  4:39 PM  Result Value Ref Range   pH, Arterial 7.440 7.35 - 7.45   pCO2 arterial 38.7 32 - 48 mmHg   pO2, Arterial 180 (H) 83 - 108 mmHg   Bicarbonate 26.6 20.0 - 28.0 mmol/L   TCO2 28 22 - 32 mmol/L   O2 Saturation 100 %   Acid-Base Excess 2.0 0.0 - 2.0 mmol/L   Sodium 136 135 - 145 mmol/L   Potassium 3.5 3.5 - 5.1 mmol/L   Calcium, Ion 1.10 (L) 1.15 - 1.40 mmol/L   HCT 21.0 (L) 39.0 - 52.0 %   Hemoglobin 7.1 (L) 13.0 - 17.0 g/dL   Patient temperature 78.2 C    Sample type ARTERIAL   Prepare RBC (crossmatch)     Status: None   Collection Time: 03/17/23  5:00 PM  Result  Value Ref Range   Order Confirmation      ORDER PROCESSED BY BLOOD BANK Performed at [facility], [address] [place], [place] [address]   I-STAT 7, (LYTES, BLD GAS, ICA, H+H)     Status: Abnormal   Collection Time: 03/17/23  6:18 PM  Result Value Ref Range   pH, Arterial 7.417 7.35 - 7.45   pCO2 arterial 38.6 32 - 48 mmHg   pO2, Arterial 104 83 - 108 mmHg   Bicarbonate 24.9 20.0 - 28.0 mmol/L   TCO2 26 22 - 32 mmol/L   O2 Saturation 98 %   Acid-Base Excess 0.0 0.0 - 2.0 mmol/L   Sodium 138 135 - 145 mmol/L   Potassium 3.7 3.5 - 5.1 mmol/L   Calcium, Ion 1.10 (L) 1.15 - 1.40 mmol/L   HCT 24.0 (L)  Orthopaedic Trauma Service Progress Note  Patient ID: [name] MRN: [id number] DOB/AGE: 05/04/1996 27 y.o.  Subjective:  Pt extubated this am  Given magnitude of injuries he looks very good  Family at bedside    ROS As above  Objective:   VITALS:   Vitals:   03/18/23 0500 03/18/23 0600 03/18/23 0700 03/18/23 0800  BP: 122/71 116/69 114/69   Pulse: 75 64 65 63  Resp: (!) 22 18 19 19   Temp: 98.4 F (36.9 C) 98.8 F (37.1 C) 98.4 F (36.9 C) 98.2 F (36.8 C)  TempSrc:    Esophageal  SpO2: 96% 97% 98% 99%  Weight:      Height:        Estimated body mass index is 31.44 kg/m as calculated from the following:   Height as of this encounter: 5\' 5"  (1.651 m).   Weight as of this encounter: 85.7 kg.   Intake/Output      06/17 0701 06/18 0700 06/18 0701 06/19 0700   I.V. (mL/kg) 2753.7 (32.1) 107.3 (1.3)   Blood 318    NG/GT 60    IV Piggyback 820.1    Total Intake(mL/kg) 3951.8 (46.1) 107.3 (1.3)   Urine (mL/kg/hr) 1410 (0.7)    Other 150    Blood 50    Total Output 1610    Net +2341.8 +107.3          LABS  Results for orders placed or performed during the hospital encounter of 03/15/23 (from the past 24 hour(s))  I-STAT 7, (LYTES, BLD GAS, ICA, H+H)     Status: Abnormal   Collection Time: 03/17/23  4:39 PM  Result Value Ref Range   pH, Arterial 7.440 7.35 - 7.45   pCO2 arterial 38.7 32 - 48 mmHg   pO2, Arterial 180 (H) 83 - 108 mmHg   Bicarbonate 26.6 20.0 - 28.0 mmol/L   TCO2 28 22 - 32 mmol/L   O2 Saturation 100 %   Acid-Base Excess 2.0 0.0 - 2.0 mmol/L   Sodium 136 135 - 145 mmol/L   Potassium 3.5 3.5 - 5.1 mmol/L   Calcium, Ion 1.10 (L) 1.15 - 1.40 mmol/L   HCT 21.0 (L) 39.0 - 52.0 %   Hemoglobin 7.1 (L) 13.0 - 17.0 g/dL   Patient temperature 78.2 C    Sample type ARTERIAL   Prepare RBC (crossmatch)     Status: None   Collection Time: 03/17/23  5:00 PM  Result  Value Ref Range   Order Confirmation      ORDER PROCESSED BY BLOOD BANK Performed at [facility], [address] [place], [place] [address]   I-STAT 7, (LYTES, BLD GAS, ICA, H+H)     Status: Abnormal   Collection Time: 03/17/23  6:18 PM  Result Value Ref Range   pH, Arterial 7.417 7.35 - 7.45   pCO2 arterial 38.6 32 - 48 mmHg   pO2, Arterial 104 83 - 108 mmHg   Bicarbonate 24.9 20.0 - 28.0 mmol/L   TCO2 26 22 - 32 mmol/L   O2 Saturation 98 %   Acid-Base Excess 0.0 0.0 - 2.0 mmol/L   Sodium 138 135 - 145 mmol/L   Potassium 3.7 3.5 - 5.1 mmol/L   Calcium, Ion 1.10 (L) 1.15 - 1.40 mmol/L   HCT 24.0 (L)  After 5pm and on the weekends please log on to Amion, go to orthopaedics and the look under the [facility] Call for the provider(s) on call. You can also call our office at [phone] and then follow the prompts to be connected to the call team.  Patient ID: [name], male   DOB: 07-28-1996, 27 y.o.   MRN: [id number]

## 2023-03-19 ENCOUNTER — Encounter (HOSPITAL_COMMUNITY): Payer: Self-pay | Admitting: Orthopedic Surgery

## 2023-03-19 LAB — BPAM RBC
Blood Product Expiration Date: 202407112359
Blood Product Expiration Date: 202407122359
Blood Product Expiration Date: 202407122359
Blood Product Expiration Date: 202407122359
Blood Product Expiration Date: 202407142359
Blood Product Expiration Date: 202407142359
Blood Product Expiration Date: 202407142359
Blood Product Expiration Date: 202407152359
Blood Product Expiration Date: 202407152359
Blood Product Expiration Date: 202407162359
Blood Product Expiration Date: 202407162359
Blood Product Expiration Date: 202407172359
Blood Product Expiration Date: 202407202359
Blood Product Expiration Date: 202407232359
ISSUE DATE / TIME: 202406151857
ISSUE DATE / TIME: 202406151857
ISSUE DATE / TIME: 202406151906
ISSUE DATE / TIME: 202406151906
ISSUE DATE / TIME: 202406151906
ISSUE DATE / TIME: 202406151906
ISSUE DATE / TIME: 202406151906
ISSUE DATE / TIME: 202406151906
ISSUE DATE / TIME: 202406151907
ISSUE DATE / TIME: 202406161141
ISSUE DATE / TIME: 202406171231
ISSUE DATE / TIME: 202406171303
ISSUE DATE / TIME: 202406171645
ISSUE DATE / TIME: 202406181626
ISSUE DATE / TIME: 202406181634
ISSUE DATE / TIME: 202406181714
ISSUE DATE / TIME: 202406181714
ISSUE DATE / TIME: 202406181753
Unit Type and Rh: 5100
Unit Type and Rh: 5100
Unit Type and Rh: 5100
Unit Type and Rh: 5100
Unit Type and Rh: 5100
Unit Type and Rh: 5100
Unit Type and Rh: 5100
Unit Type and Rh: 5100
Unit Type and Rh: 5100
Unit Type and Rh: 5100
Unit Type and Rh: 5100
Unit Type and Rh: 5100
Unit Type and Rh: 5100

## 2023-03-19 LAB — TYPE AND SCREEN
ABO/RH(D): O POS
Unit division: 0
Unit division: 0
Unit division: 0
Unit division: 0
Unit division: 0
Unit division: 0
Unit division: 0
Unit division: 0
Unit division: 0
Unit division: 0
Unit division: 0
Unit division: 0
Unit division: 0
Unit division: 0
Unit division: 0

## 2023-03-19 LAB — BASIC METABOLIC PANEL
Anion gap: 12 (ref 5–15)
BUN: 14 mg/dL (ref 6–20)
CO2: 23 mmol/L (ref 22–32)
Calcium: 8.1 mg/dL — ABNORMAL LOW (ref 8.9–10.3)
Chloride: 100 mmol/L (ref 98–111)
Creatinine, Ser: 0.62 mg/dL (ref 0.61–1.24)
GFR, Estimated: >60 mL/min (ref >60)
Glucose, Bld: 104 mg/dL — ABNORMAL HIGH (ref 70–99)
Potassium: 3.9 mmol/L (ref 3.5–5.1)
Sodium: 135 mmol/L (ref 135–145)

## 2023-03-19 LAB — CBC
HCT: 25 % — ABNORMAL LOW (ref 39.0–52.0)
Hemoglobin: 8.5 g/dL — ABNORMAL LOW (ref 13.0–17.0)
MCH: 30.5 pg (ref 26.0–34.0)
MCHC: 34 g/dL (ref 30.0–36.0)
MCV: 89.6 fL (ref 80.0–100.0)
Platelets: 165 10*3/uL (ref 150–400)
RBC: 2.79 MIL/uL — ABNORMAL LOW (ref 4.22–5.81)
RDW: 14 % (ref 11.5–15.5)
WBC: 10 10*3/uL (ref 4.0–10.5)
nRBC: 0 % (ref 0.0–0.2)

## 2023-03-19 LAB — HEMOGLOBIN AND HEMATOCRIT, BLOOD
HCT: 25.9 % — ABNORMAL LOW (ref 39.0–52.0)
Hemoglobin: 8.7 g/dL — ABNORMAL LOW (ref 13.0–17.0)

## 2023-03-19 MED ORDER — ONDANSETRON HCL 4 MG/2ML IJ SOLN
INTRAMUSCULAR | Status: AC
  Filled 2023-03-19: qty 2

## 2023-03-19 MED ORDER — HYDROMORPHONE HCL 1 MG/ML IJ SOLN
0.5000 mg | INTRAMUSCULAR | Status: DC | PRN
  Administered 2023-03-19 – 2023-03-21 (×7): 1 mg via INTRAVENOUS
  Filled 2023-03-19 (×6): qty 1
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
## 2023-03-19 NOTE — Progress Notes (Signed)
OT Cancellation Note  Patient Details Name: Carl Soluri MRN: [id number] DOB: 1996-08-03   Cancelled Treatment:    Reason Eval/Treat Not Completed: Other (comment) (Pt having dressing changed. Nsg asking to wait until 4 pm. Will follow up later time.)  [facility] 03/19/2023, 2:24 PM Luisa Dago, OT/L   Acute OT Clinical Specialist Acute Rehabilitation Services Pager [phone] Office [phone]
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
## 2023-03-20 ENCOUNTER — Inpatient Hospital Stay (HOSPITAL_COMMUNITY): Payer: Self-pay

## 2023-03-20 ENCOUNTER — Encounter (HOSPITAL_COMMUNITY): Admission: EM | Disposition: A | Discharge status: Rehabilitation Facility | Payer: Self-pay | Source: Home / Self Care

## 2023-03-20 ENCOUNTER — Other Ambulatory Visit: Payer: Self-pay

## 2023-03-20 ENCOUNTER — Inpatient Hospital Stay (HOSPITAL_COMMUNITY): Payer: Self-pay | Admitting: Certified Registered Nurse Anesthetist

## 2023-03-20 DIAGNOSIS — D649 Anemia, unspecified: Secondary | ICD-10-CM

## 2023-03-20 DIAGNOSIS — S31501A Unspecified open wound of unspecified external genital organs, male, initial encounter: Secondary | ICD-10-CM

## 2023-03-20 DIAGNOSIS — T8189XA Other complications of procedures, not elsewhere classified, initial encounter: Secondary | ICD-10-CM

## 2023-03-20 HISTORY — PX: INCISION AND DRAINAGE PERIRECTAL ABSCESS: SHX1804

## 2023-03-20 HISTORY — PX: TIBIA IM NAIL INSERTION: SHX2516

## 2023-03-20 HISTORY — PX: ORIF ELBOW FRACTURE: SHX5031

## 2023-03-20 LAB — TYPE AND SCREEN
ABO/RH(D): O POS
Antibody Screen: NEGATIVE

## 2023-03-20 LAB — BASIC METABOLIC PANEL
Anion gap: 15 (ref 5–15)
BUN: 13 mg/dL (ref 6–20)
CO2: 22 mmol/L (ref 22–32)
Calcium: 7.9 mg/dL — ABNORMAL LOW (ref 8.9–10.3)
Chloride: 98 mmol/L (ref 98–111)
Creatinine, Ser: 0.56 mg/dL — ABNORMAL LOW (ref 0.61–1.24)
GFR, Estimated: >60 mL/min (ref >60)
Glucose, Bld: 87 mg/dL (ref 70–99)
Potassium: 3.3 mmol/L — ABNORMAL LOW (ref 3.5–5.1)
Sodium: 135 mmol/L (ref 135–145)

## 2023-03-20 LAB — CBC
HCT: 24.8 % — ABNORMAL LOW (ref 39.0–52.0)
Hemoglobin: 8.3 g/dL — ABNORMAL LOW (ref 13.0–17.0)
MCH: 30.1 pg (ref 26.0–34.0)
MCHC: 33.5 g/dL (ref 30.0–36.0)
MCV: 89.9 fL (ref 80.0–100.0)
Platelets: 176 10*3/uL (ref 150–400)
RBC: 2.76 MIL/uL — ABNORMAL LOW (ref 4.22–5.81)
RDW: 14 % (ref 11.5–15.5)
WBC: 8 10*3/uL (ref 4.0–10.5)
nRBC: 0.6 % — ABNORMAL HIGH (ref 0.0–0.2)

## 2023-03-20 SURGERY — INCISION AND DRAINAGE, ABSCESS, PERIRECTAL
Anesthesia: General | Site: Elbow | Laterality: Right

## 2023-03-20 MED ORDER — KETAMINE HCL 10 MG/ML IJ SOLN
INTRAMUSCULAR | Status: DC | PRN
  Administered 2023-03-20 (×2): 10 mg via INTRAVENOUS
  Administered 2023-03-20: 30 mg via INTRAVENOUS

## 2023-03-20 MED ORDER — LIDOCAINE 2% (20 MG/ML) 5 ML SYRINGE
INTRAMUSCULAR | Status: AC
  Filled 2023-03-20: qty 5

## 2023-03-20 MED ORDER — DEXAMETHASONE SODIUM PHOSPHATE 10 MG/ML IJ SOLN
INTRAMUSCULAR | Status: AC
  Filled 2023-03-20: qty 1

## 2023-03-20 MED ORDER — 0.9 % SODIUM CHLORIDE (POUR BTL) OPTIME
TOPICAL | Status: DC | PRN
  Administered 2023-03-20 (×2): 1000 mL

## 2023-03-20 MED ORDER — HYDROMORPHONE HCL 1 MG/ML IJ SOLN
INTRAMUSCULAR | Status: DC | PRN
  Administered 2023-03-20 (×3): .5 mg via INTRAVENOUS

## 2023-03-20 MED ORDER — KETAMINE HCL 50 MG/5ML IJ SOSY
PREFILLED_SYRINGE | INTRAMUSCULAR | Status: AC
  Filled 2023-03-20: qty 5

## 2023-03-20 MED ORDER — FENTANYL CITRATE (PF) 250 MCG/5ML IJ SOLN
INTRAMUSCULAR | Status: AC
  Filled 2023-03-20: qty 5

## 2023-03-20 MED ORDER — METHOCARBAMOL 500 MG PO TABS
1000.0000 mg | ORAL_TABLET | Freq: Three times a day (TID) | ORAL | Status: DC
  Administered 2023-03-20 – 2023-03-21 (×2): 1000 mg via ORAL
  Filled 2023-03-20 (×2): qty 2

## 2023-03-20 MED ORDER — HYDROMORPHONE HCL 1 MG/ML IJ SOLN
INTRAMUSCULAR | Status: AC
  Filled 2023-03-20: qty 1

## 2023-03-20 MED ORDER — HYDROMORPHONE HCL 1 MG/ML IJ SOLN
INTRAMUSCULAR | Status: AC
  Filled 2023-03-20: qty 0.5

## 2023-03-20 MED ORDER — POTASSIUM CHLORIDE 20 MEQ PO PACK
40.0000 meq | PACK | ORAL | Status: AC
  Filled 2023-03-20: qty 2

## 2023-03-20 MED ORDER — ONDANSETRON HCL 4 MG/2ML IJ SOLN: 4.0000 mg | Freq: Once | INTRAMUSCULAR | Status: DC | PRN

## 2023-03-20 MED ORDER — ROCURONIUM BROMIDE 10 MG/ML (PF) SYRINGE
PREFILLED_SYRINGE | INTRAVENOUS | Status: AC
  Filled 2023-03-20: qty 30

## 2023-03-20 MED ORDER — PROPOFOL 10 MG/ML IV BOLUS
INTRAVENOUS | Status: DC | PRN
  Administered 2023-03-20: 130 mg via INTRAVENOUS

## 2023-03-20 MED ORDER — HYDROMORPHONE HCL 1 MG/ML IJ SOLN
0.2500 mg | INTRAMUSCULAR | Status: DC | PRN
  Administered 2023-03-20 (×2): 0.5 mg via INTRAVENOUS

## 2023-03-20 MED ORDER — MIDAZOLAM HCL 2 MG/2ML IJ SOLN
INTRAMUSCULAR | Status: DC | PRN
  Administered 2023-03-20: 2 mg via INTRAVENOUS

## 2023-03-20 MED ORDER — POLYETHYLENE GLYCOL 3350 17 G PO PACK
17.0000 g | PACK | Freq: Every day | ORAL | Status: DC
  Administered 2023-03-21 – 2023-03-23 (×3): 17 g via ORAL
  Filled 2023-03-20 (×2): qty 1

## 2023-03-20 MED ORDER — STERILE WATER FOR IRRIGATION IR SOLN
Status: DC | PRN
  Administered 2023-03-20: 1000 mL

## 2023-03-20 MED ORDER — ONDANSETRON HCL 4 MG/2ML IJ SOLN
INTRAMUSCULAR | Status: DC | PRN
  Administered 2023-03-20: 4 mg via INTRAVENOUS

## 2023-03-20 MED ORDER — LIDOCAINE 2% (20 MG/ML) 5 ML SYRINGE
INTRAMUSCULAR | Status: DC | PRN
  Administered 2023-03-20: 100 mg via INTRAVENOUS

## 2023-03-20 MED ORDER — SODIUM CHLORIDE 0.9 % IR SOLN
Status: DC | PRN
  Administered 2023-03-20 (×2): 3000 mL

## 2023-03-20 MED ORDER — DEXAMETHASONE SODIUM PHOSPHATE 10 MG/ML IJ SOLN
INTRAMUSCULAR | Status: DC | PRN
  Administered 2023-03-20: 10 mg via INTRAVENOUS

## 2023-03-20 MED ORDER — PROPOFOL 500 MG/50ML IV EMUL
INTRAVENOUS | Status: DC | PRN
  Administered 2023-03-20: 50 ug/kg/min via INTRAVENOUS

## 2023-03-20 MED ORDER — FENTANYL CITRATE (PF) 250 MCG/5ML IJ SOLN
INTRAMUSCULAR | Status: DC | PRN
  Administered 2023-03-20 (×3): 50 ug via INTRAVENOUS
  Administered 2023-03-20: 100 ug via INTRAVENOUS

## 2023-03-20 MED ORDER — DOCUSATE SODIUM 100 MG PO CAPS
100.0000 mg | ORAL_CAPSULE | Freq: Two times a day (BID) | ORAL | Status: DC
  Administered 2023-03-20 – 2023-03-27 (×15): 100 mg via ORAL
  Filled 2023-03-20 (×14): qty 1

## 2023-03-20 MED ORDER — ONDANSETRON HCL 4 MG/2ML IJ SOLN
INTRAMUSCULAR | Status: AC
  Filled 2023-03-20: qty 2

## 2023-03-20 MED ORDER — CEFAZOLIN SODIUM-DEXTROSE 2-4 GM/100ML-% IV SOLN
2.0000 g | Freq: Three times a day (TID) | INTRAVENOUS | Status: AC
  Administered 2023-03-20 – 2023-03-21 (×3): 2 g via INTRAVENOUS
  Filled 2023-03-20 (×3): qty 100

## 2023-03-20 MED ORDER — OXYCODONE HCL 5 MG PO TABS
5.0000 mg | ORAL_TABLET | ORAL | Status: DC | PRN
  Administered 2023-03-21: 10 mg via ORAL
  Filled 2023-03-20 (×2): qty 2

## 2023-03-20 MED ORDER — LACTATED RINGERS IV SOLN: INTRAVENOUS | Status: DC | PRN

## 2023-03-20 MED ORDER — ROCURONIUM BROMIDE 10 MG/ML (PF) SYRINGE
PREFILLED_SYRINGE | INTRAVENOUS | Status: DC | PRN
  Administered 2023-03-20: 60 mg via INTRAVENOUS
  Administered 2023-03-20: 50 mg via INTRAVENOUS
  Administered 2023-03-20: 40 mg via INTRAVENOUS
  Administered 2023-03-20 (×2): 50 mg via INTRAVENOUS

## 2023-03-20 MED ORDER — ACETAMINOPHEN 500 MG PO TABS
1000.0000 mg | ORAL_TABLET | Freq: Four times a day (QID) | ORAL | Status: DC
  Administered 2023-03-20 – 2023-03-28 (×26): 1000 mg via ORAL
  Filled 2023-03-20 (×29): qty 2

## 2023-03-20 MED ORDER — PHENYLEPHRINE HCL-NACL 20-0.9 MG/250ML-% IV SOLN
INTRAVENOUS | Status: DC | PRN
  Administered 2023-03-20: 50 ug/min via INTRAVENOUS

## 2023-03-20 MED ORDER — MIDAZOLAM HCL 2 MG/2ML IJ SOLN
INTRAMUSCULAR | Status: AC
  Filled 2023-03-20: qty 2

## 2023-03-20 MED ORDER — VASHE WOUND IRRIGATION OPTIME
TOPICAL | Status: DC | PRN
  Administered 2023-03-20 (×2): 34 [oz_av]

## 2023-03-20 MED ORDER — SUGAMMADEX SODIUM 200 MG/2ML IV SOLN
INTRAVENOUS | Status: DC | PRN
  Administered 2023-03-20: 171.4 mg via INTRAVENOUS

## 2023-03-20 SURGICAL SUPPLY — 123 items
ANCH SUT 2 SHRT 1.45 DRLBT (Anchor) ×3 IMPLANT
ANCHOR JUGGERKNOT W/DRL 2/1.45 (Anchor) IMPLANT
APL SKNCLS STERI-STRIP NONHPOA (GAUZE/BANDAGES/DRESSINGS)
BAG COUNTER SPONGE SURGICOUNT (BAG) ×6 IMPLANT
BAG SPNG CNTER NS LX DISP (BAG) ×6
BENZOIN TINCTURE PRP APPL 2/3 (GAUZE/BANDAGES/DRESSINGS) IMPLANT
BIT DRILL CALIBRATED 4.2 (BIT) IMPLANT
BIT DRILL CANN 3.0 QC 215 (BIT) IMPLANT
BIT DRILL SHORT 3.2MM (DRILL) IMPLANT
BIT DRILL SHORT 4.2 (BIT) IMPLANT
BLADE AVERAGE 25X9 (BLADE) ×3 IMPLANT
BLADE CLIPPER SURG (BLADE) ×3 IMPLANT
BLADE SURG 10 STRL SS (BLADE) ×3 IMPLANT
BNDG CMPR 5X3 KNIT ELC UNQ LF (GAUZE/BANDAGES/DRESSINGS) ×3
BNDG CMPR 9X4 STRL LF SNTH (GAUZE/BANDAGES/DRESSINGS) ×3
BNDG CMPR MD 5X2 ELC HKLP STRL (GAUZE/BANDAGES/DRESSINGS) ×6
BNDG COHESIVE 4X5 TAN STRL (GAUZE/BANDAGES/DRESSINGS) ×3 IMPLANT
BNDG ELASTIC 2 VLCR STRL LF (GAUZE/BANDAGES/DRESSINGS) IMPLANT
BNDG ELASTIC 3INX 5YD STR LF (GAUZE/BANDAGES/DRESSINGS) ×3 IMPLANT
BNDG ELASTIC 4X5.8 VLCR STR LF (GAUZE/BANDAGES/DRESSINGS) ×3 IMPLANT
BNDG ELASTIC 6X5.8 VLCR STR LF (GAUZE/BANDAGES/DRESSINGS) ×3 IMPLANT
BNDG ESMARK 4X9 LF (GAUZE/BANDAGES/DRESSINGS) ×3 IMPLANT
BNDG GAUZE DERMACEA FLUFF 4 (GAUZE/BANDAGES/DRESSINGS) ×3 IMPLANT
BNDG GZE 12X3 1 PLY HI ABS (GAUZE/BANDAGES/DRESSINGS) ×18
BNDG GZE DERMACEA 4 6PLY (GAUZE/BANDAGES/DRESSINGS) ×3
BNDG STRETCH GAUZE 3IN X12FT (GAUZE/BANDAGES/DRESSINGS) IMPLANT
BRUSH SCRUB EZ PLAIN DRY (MISCELLANEOUS) ×6 IMPLANT
CANISTER SUCT 3000ML PPV (MISCELLANEOUS) ×3 IMPLANT
CLEANER TIP ELECTROSURG 2X2 (MISCELLANEOUS) ×3 IMPLANT
CLEANSER WND VASHE INSTL 34OZ (WOUND CARE) IMPLANT
COVER MAYO STAND STRL (DRAPES) IMPLANT
COVER SURGICAL LIGHT HANDLE (MISCELLANEOUS) ×9 IMPLANT
CUFF TOURN SGL QUICK 18X4 (TOURNIQUET CUFF) IMPLANT
CUFF TOURN SGL QUICK 24 (TOURNIQUET CUFF)
CUFF TRNQT CYL 24X4X16.5-23 (TOURNIQUET CUFF) IMPLANT
DRAPE C-ARM 42X72 X-RAY (DRAPES) ×3 IMPLANT
DRAPE C-ARMOR (DRAPES) ×3 IMPLANT
DRAPE HALF SHEET 40X57 (DRAPES) IMPLANT
DRAPE INCISE IOBAN 66X45 STRL (DRAPES) IMPLANT
DRAPE U-SHAPE 47X51 STRL (DRAPES) ×3 IMPLANT
DRESSING MORCELLS FINE 1000 (Tissue) IMPLANT
DRILL BIT CALIBRATED 4.2 (BIT) ×3
DRILL BIT SHORT 4.2 (BIT) ×3
DRILL SHORT 3.2MM (DRILL) ×3
DRSG ADAPTIC 3X8 NADH LF (GAUZE/BANDAGES/DRESSINGS) ×3 IMPLANT
DRSG EMULSION OIL 3X3 NADH (GAUZE/BANDAGES/DRESSINGS) IMPLANT
DRSG MEPITEL 4X7.2 (GAUZE/BANDAGES/DRESSINGS) ×3 IMPLANT
DRSG MEPITEL 8X12 (GAUZE/BANDAGES/DRESSINGS) IMPLANT
ELECT REM PT RETURN 9FT ADLT (ELECTROSURGICAL) ×6
ELECTRODE REM PT RTRN 9FT ADLT (ELECTROSURGICAL) ×6 IMPLANT
FACESHIELD WRAPAROUND (MASK) IMPLANT
FACESHIELD WRAPAROUND OR TEAM (MASK) IMPLANT
GAUZE PAD ABD 8X10 STRL (GAUZE/BANDAGES/DRESSINGS) ×6 IMPLANT
GAUZE SPONGE 4X4 12PLY STRL (GAUZE/BANDAGES/DRESSINGS) ×3 IMPLANT
GAUZE XEROFORM 1X8 LF (GAUZE/BANDAGES/DRESSINGS) ×3 IMPLANT
GAUZE XEROFORM 5X9 LF (GAUZE/BANDAGES/DRESSINGS) ×3 IMPLANT
GLOVE BIO SURGEON STRL SZ 6.5 (GLOVE) ×3 IMPLANT
GLOVE BIO SURGEON STRL SZ7.5 (GLOVE) ×3 IMPLANT
GLOVE BIO SURGEON STRL SZ8 (GLOVE) ×3 IMPLANT
GLOVE BIO SURGEON STRL SZ8.5 (GLOVE) ×3 IMPLANT
GLOVE BIOGEL PI IND STRL 6 (GLOVE) ×3 IMPLANT
GLOVE BIOGEL PI IND STRL 7.5 (GLOVE) ×3 IMPLANT
GLOVE BIOGEL PI IND STRL 8 (GLOVE) ×3 IMPLANT
GLOVE SURG ORTHO LTX SZ7.5 (GLOVE) ×6 IMPLANT
GOWN STRL REUS W/ TWL LRG LVL3 (GOWN DISPOSABLE) ×12 IMPLANT
GOWN STRL REUS W/ TWL XL LVL3 (GOWN DISPOSABLE) ×3 IMPLANT
GOWN STRL REUS W/TWL LRG LVL3 (GOWN DISPOSABLE) ×12
GOWN STRL REUS W/TWL XL LVL3 (GOWN DISPOSABLE) ×3
GRAFT MYRIAD 2 LAYER 10X20 (Graft) IMPLANT
GUIDEWIRE 1.6X220 TOCAR TIP (WIRE) IMPLANT
GUIDEWIRE 3.2X400 (WIRE) IMPLANT
HANDPIECE INTERPULSE COAX TIP (DISPOSABLE) ×3
KIT BASIN OR (CUSTOM PROCEDURE TRAY) ×6 IMPLANT
KIT TURNOVER KIT B (KITS) ×6 IMPLANT
MANIFOLD NEPTUNE II (INSTRUMENTS) ×3 IMPLANT
NAIL TIB TFNA 8X345 (Nail) IMPLANT
NS IRRIG 1000ML POUR BTL (IV SOLUTION) ×6 IMPLANT
PACK GENERAL/GYN (CUSTOM PROCEDURE TRAY) ×3 IMPLANT
PACK ORTHO EXTREMITY (CUSTOM PROCEDURE TRAY) ×3 IMPLANT
PAD ABD 8X10 STRL (GAUZE/BANDAGES/DRESSINGS) IMPLANT
PAD ARMBOARD 7.5X6 YLW CONV (MISCELLANEOUS) ×9 IMPLANT
PAD CAST 3X4 CTTN HI CHSV (CAST SUPPLIES) IMPLANT
PAD CAST 4YDX4 CTTN HI CHSV (CAST SUPPLIES) ×3 IMPLANT
PADDING CAST COTTON 3X4 STRL (CAST SUPPLIES) ×3
PADDING CAST COTTON 4X4 STRL (CAST SUPPLIES) ×6
PADDING CAST COTTON 6X4 STRL (CAST SUPPLIES) ×3 IMPLANT
PENCIL SMOKE EVACUATOR (MISCELLANEOUS) ×3 IMPLANT
POWDER MORCELSS FINE 2000MG (Miscellaneous) IMPLANT
PWDR MORCELSS FINE 2000MG (Miscellaneous) ×3 IMPLANT
REAMER ROD DEEP FLUTE 2.5X950 (INSTRUMENTS) IMPLANT
SCREW CANN LT 5.5X65 (Screw) IMPLANT
SCREW LOCK IM NAIL 4X32 STRL (Screw) IMPLANT
SCREW LOCK IM NAIL 4X36 STRL (Screw) IMPLANT
SCREW LOCK IM NAIL 5X56 (Screw) IMPLANT
SCREW LOCK IM NAIL 5X66 (Screw) IMPLANT
SET HNDPC FAN SPRY TIP SCT (DISPOSABLE) IMPLANT
SPIKE FLUID TRANSFER (MISCELLANEOUS) IMPLANT
SPONGE T-LAP 18X18 ~~LOC~~+RFID (SPONGE) ×6 IMPLANT
STAPLER VISISTAT 35W (STAPLE) ×3 IMPLANT
STRIP CLOSURE SKIN 1/2X4 (GAUZE/BANDAGES/DRESSINGS) IMPLANT
SUCTION TUBE FRAZIER 10FR DISP (SUCTIONS) ×3 IMPLANT
SUCTION TUBE FRAZIER 12FR DISP (SUCTIONS) IMPLANT
SUT ETHILON 2 0 FS 18 (SUTURE) ×6 IMPLANT
SUT ETHILON 2 0 PSLX (SUTURE) IMPLANT
SUT PDS AB 2-0 CT1 27 (SUTURE) IMPLANT
SUT PROLENE 3 0 PS 2 (SUTURE) ×6 IMPLANT
SUT VIC AB 0 CT1 27 (SUTURE) ×6
SUT VIC AB 0 CT1 27XBRD ANBCTR (SUTURE) ×6 IMPLANT
SUT VIC AB 1 CT1 27 (SUTURE) ×6
SUT VIC AB 1 CT1 27XBRD ANBCTR (SUTURE) IMPLANT
SUT VIC AB 2-0 CT1 27 (SUTURE) ×9
SUT VIC AB 2-0 CT1 TAPERPNT 27 (SUTURE) ×6 IMPLANT
SUT VIC AB 2-0 CT3 27 (SUTURE) IMPLANT
SUT VIC AB 2-0 SH 18 (SUTURE) IMPLANT
SYR BULB IRRIG 60ML STRL (SYRINGE) IMPLANT
SYR CONTROL 10ML LL (SYRINGE) ×3 IMPLANT
TOWEL GREEN STERILE (TOWEL DISPOSABLE) ×9 IMPLANT
TOWEL GREEN STERILE FF (TOWEL DISPOSABLE) ×6 IMPLANT
TUBE CONNECTING 12X1/4 (SUCTIONS) ×3 IMPLANT
UNDERPAD 30X36 HEAVY ABSORB (UNDERPADS AND DIAPERS) ×3 IMPLANT
WASHER FOR CANN SCRW 5.5 STL (Washer) IMPLANT
WATER STERILE IRR 1000ML POUR (IV SOLUTION) ×3 IMPLANT
YANKAUER SUCT BULB TIP NO VENT (SUCTIONS) ×3 IMPLANT
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
## 2023-03-20 NOTE — Progress Notes (Signed)
PT Cancellation Note  Patient Details Name: [name] MRN: [id number] DOB: April 22, 1996   Cancelled Treatment:    Reason Eval/Treat Not Completed: Patient at procedure or test/unavailable  Patient remains off the floor in surgery. If unable to see today, will see tomorrow.   Jerolyn Center, PT Acute Rehabilitation Services  Office [phone]   Zena Amos 03/20/2023, 12:32 PM

## 2023-03-20 NOTE — Progress Notes (Signed)
   Inpatient Rehab Admissions Coordinator :  Per therapy recommendations patient was screened for CIR candidacy by Nakeda Lebron RN MSN. Patient is not yet at a level to tolerate the intensity required to pursue a CIR admit . Patient may have the potential to progress to become a candidate. The CIR admissions team will follow and monitor for progress and place a Rehab Consult order if felt to be appropriate. Please contact me with any questions.  Jermone Geister RN MSN Admissions Coordinator [phone]  

## 2023-03-20 NOTE — Transfer of Care (Signed)
Immediate Anesthesia Transfer of Care Note  Patient: [name]  Procedure(s) Performed: IRRIGATION AND DEBRIDEMENT PERINEUM WITH MYRIAD APPLICATION INTRAMEDULLARY (IM) NAIL TIBIAL (Left) OPEN REDUCTION INTERNAL FIXATION (ORIF) RIGHT ELBOW FRACTURE (Right: Elbow)  Patient Location: PACU  Anesthesia Type:General  Level of Consciousness: awake and alert   Airway & Oxygen Therapy: Patient Spontanous Breathing and Patient connected to face mask oxygen  Post-op Assessment: Report given to RN and Post -op Vital signs reviewed and stable  Post vital signs: Reviewed and stable  Last Vitals:  Vitals Value Taken Time  BP 123/77 03/20/23 1446  Temp 98   Pulse 96 03/20/23 1457  Resp 23 03/20/23 1457  SpO2 96 % 03/20/23 1457  Vitals shown include unvalidated device data.  Last Pain:  Vitals:   03/20/23 0621  TempSrc:   PainSc: Asleep      Patients Stated Pain Goal: 0 (03/17/23 1119)  Complications: No notable events documented.
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
## 2023-03-20 NOTE — Progress Notes (Signed)
Extubated. No changes overnight.  Dr. Bedelia Person to perform her procedure first and Ortho to follow.  The risks and benefits of surgery were discussed with the patient, including the possibility of infection, nerve injury, vessel injury, wound breakdown, arthritis, symptomatic hardware, DVT/ PE, loss of motion, malunion, nonunion, and need for further surgery among others.  We also specifically discussed the need to stage surgery because of the elevated risk of soft tissue breakdown that could lead to amputation.  These risks were acknowledged and consent provided to proceed.  Myrene Galas, MD [facility], [facility] [phone]

## 2023-03-20 NOTE — Anesthesia Procedure Notes (Signed)
Procedure Name: Intubation Date/Time: 03/20/2023 9:16 AM  Performed by: Darryl Nestle, CRNAPre-anesthesia Checklist: Patient identified, Emergency Drugs available, Suction available and Patient being monitored Patient Re-evaluated:Patient Re-evaluated prior to induction Oxygen Delivery Method: Circle system utilized Preoxygenation: Pre-oxygenation with 100% oxygen Induction Type: IV induction Ventilation: Mask ventilation without difficulty Laryngoscope Size: Mac and 3 Grade View: Grade I Tube type: Oral Tube size: 7.0 mm Number of attempts: 1 Airway Equipment and Method: Stylet Placement Confirmation: ETT inserted through vocal cords under direct vision, positive ETCO2 and breath sounds checked- equal and bilateral Secured at: 23 cm Tube secured with: Tape Dental Injury: Teeth and Oropharynx as per pre-operative assessment

## 2023-03-20 NOTE — Progress Notes (Signed)
OT Cancellation Note  Patient Details Name: [name] MRN: [id number] DOB: [birth year]   Cancelled Treatment:    Reason Eval/Treat Not Completed: Patient at procedure or test/ unavailable. Will follow and see when appropriate and able.   Barry Brunner, OT Acute Rehabilitation Services Office [phone]   Chancy Milroy 03/20/2023, 8:47 AM

## 2023-03-20 NOTE — Anesthesia Postprocedure Evaluation (Signed)
Anesthesia Post Note  Patient: [name]  Procedure(s) Performed: IRRIGATION AND DEBRIDEMENT PERINEUM WITH MYRIAD APPLICATION INTRAMEDULLARY (IM) NAIL TIBIAL (Left) OPEN REDUCTION INTERNAL FIXATION (ORIF) RIGHT ELBOW FRACTURE (Right: Elbow)     Patient location during evaluation: PACU Anesthesia Type: General Level of consciousness: awake and alert Pain management: pain level controlled Vital Signs Assessment: post-procedure vital signs reviewed and stable Respiratory status: spontaneous breathing, nonlabored ventilation, respiratory function stable and patient connected to nasal cannula oxygen Cardiovascular status: blood pressure returned to baseline and stable Postop Assessment: no apparent nausea or vomiting Anesthetic complications: no  No notable events documented.  Last Vitals:  Vitals:   03/20/23 1500 03/20/23 1515  BP: 132/75 131/77  Pulse: 95 90  Resp: (!) 23 14  Temp:    SpO2: 95% 100%    Last Pain:  Vitals:   03/20/23 0621  TempSrc:   PainSc: Asleep                 Ercelle Winkles S

## 2023-03-21 MED ORDER — GABAPENTIN 300 MG PO CAPS
300.0000 mg | ORAL_CAPSULE | Freq: Three times a day (TID) | ORAL | Status: DC
  Administered 2023-03-21 – 2023-03-23 (×7): 300 mg via ORAL
  Filled 2023-03-21 (×7): qty 1

## 2023-03-21 MED ORDER — METHOCARBAMOL 500 MG PO TABS
1000.0000 mg | ORAL_TABLET | Freq: Four times a day (QID) | ORAL | Status: DC
  Administered 2023-03-21 – 2023-03-23 (×8): 1000 mg via ORAL
  Filled 2023-03-21 (×8): qty 2

## 2023-03-21 MED ORDER — OXYCODONE HCL 5 MG PO TABS
10.0000 mg | ORAL_TABLET | ORAL | Status: DC | PRN
  Administered 2023-03-21: 15 mg via ORAL
  Administered 2023-03-21: 10 mg via ORAL
  Administered 2023-03-22 – 2023-03-25 (×11): 15 mg via ORAL
  Administered 2023-03-25: 10 mg via ORAL
  Administered 2023-03-25 – 2023-03-26 (×3): 15 mg via ORAL
  Administered 2023-03-26 (×2): 10 mg via ORAL
  Administered 2023-03-26 – 2023-03-27 (×3): 15 mg via ORAL
  Filled 2023-03-21 (×8): qty 3
  Filled 2023-03-21: qty 2
  Filled 2023-03-21 (×7): qty 3
  Filled 2023-03-21: qty 2
  Filled 2023-03-21: qty 3
  Filled 2023-03-21 (×2): qty 2
  Filled 2023-03-21 (×3): qty 3

## 2023-03-21 MED ORDER — HYDROMORPHONE HCL 1 MG/ML IJ SOLN
0.5000 mg | INTRAMUSCULAR | Status: DC | PRN
  Administered 2023-03-21 – 2023-03-24 (×10): 1 mg via INTRAVENOUS
  Filled 2023-03-21 (×10): qty 1
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
## 2023-03-21 NOTE — Progress Notes (Signed)
Ortho tech called - confirmed order for night splint.

## 2023-03-21 NOTE — Progress Notes (Signed)
Orthopedic Tech Progress Note Patient Details:  [name] 10/20/1995 [id number] Called order into [facility] for night splint Patient ID: [name], male   DOB: [birth year], 27 y.o.   MRN: [id number]  [name] 03/21/2023, 10:22 AM

## 2023-03-21 NOTE — Progress Notes (Signed)
Orthopaedic Trauma Service Progress Note  Patient ID: Navi Erber MRN: [id number] DOB/AGE: 1996/08/02 27 y.o.  Subjective:  Resting in bed Family at bedside   ROS As above  Objective:   VITALS:   Vitals:   03/21/23 0327 03/21/23 0700 03/21/23 0800 03/21/23 0846  BP: 115/68 115/75 117/68   Pulse: 85 86 83 93  Resp: 16 (!) 21 19 20   Temp: 98.5 F (36.9 C) 97.6 F (36.4 C)    TempSrc: Oral Oral    SpO2: 100% 100% 100% 94%  Weight:      Height:        Estimated body mass index is 31.44 kg/m as calculated from the following:   Height as of this encounter: 5\' 5"  (1.651 m).   Weight as of this encounter: 85.7 kg.   Intake/Output      06/20 0701 06/21 0700 06/21 0701 06/22 0700   I.V. (mL/kg) 1000 (11.7)    IV Piggyback 456.9 43.1   Total Intake(mL/kg) 1456.9 (17) 43.1 (0.5)   Urine (mL/kg/hr) 1550 (0.8)    Blood 50    Total Output 1600    Net -143.1 +43.1          LABS  No results found for this or any previous visit (from the past 24 hour(s)).   PHYSICAL EXAM:   Gen: in bed, resting, NAD, awake but sleepy  Lungs: unlabored Cardiac: reg Pelvis:      Ex fix to pelvis is stable      Pinsites are stable  Ext:       Right Lower Extremity              Dressing R knee clean, dry and intact             Ext warm              + DP pulse             Mild to moderate swelling             DPN, SPN, TN sensation grossly intact and symmetric to contra-lateral side             EHL, FHL, lesser toe motor intact             Ankle flexion present  I do not really appreciate ankle extension though the pt appears to be trying              Compartments are soft              No pain out of proportion with passive stretch         Left Lower extremity              SLS clean dry and intact              Ext warm              Moderate swelling             Good perfusion              + DP  pulse             EHL, FHL motor intact             DPN, SPN, TN sensation  Orthopaedic Trauma Service Progress Note  Patient ID: Navi Erber MRN: [id number] DOB/AGE: 1996/08/02 27 y.o.  Subjective:  Resting in bed Family at bedside   ROS As above  Objective:   VITALS:   Vitals:   03/21/23 0327 03/21/23 0700 03/21/23 0800 03/21/23 0846  BP: 115/68 115/75 117/68   Pulse: 85 86 83 93  Resp: 16 (!) 21 19 20   Temp: 98.5 F (36.9 C) 97.6 F (36.4 C)    TempSrc: Oral Oral    SpO2: 100% 100% 100% 94%  Weight:      Height:        Estimated body mass index is 31.44 kg/m as calculated from the following:   Height as of this encounter: 5\' 5"  (1.651 m).   Weight as of this encounter: 85.7 kg.   Intake/Output      06/20 0701 06/21 0700 06/21 0701 06/22 0700   I.V. (mL/kg) 1000 (11.7)    IV Piggyback 456.9 43.1   Total Intake(mL/kg) 1456.9 (17) 43.1 (0.5)   Urine (mL/kg/hr) 1550 (0.8)    Blood 50    Total Output 1600    Net -143.1 +43.1          LABS  No results found for this or any previous visit (from the past 24 hour(s)).   PHYSICAL EXAM:   Gen: in bed, resting, NAD, awake but sleepy  Lungs: unlabored Cardiac: reg Pelvis:      Ex fix to pelvis is stable      Pinsites are stable  Ext:       Right Lower Extremity              Dressing R knee clean, dry and intact             Ext warm              + DP pulse             Mild to moderate swelling             DPN, SPN, TN sensation grossly intact and symmetric to contra-lateral side             EHL, FHL, lesser toe motor intact             Ankle flexion present  I do not really appreciate ankle extension though the pt appears to be trying              Compartments are soft              No pain out of proportion with passive stretch         Left Lower extremity              SLS clean dry and intact              Ext warm              Moderate swelling             Good perfusion              + DP  pulse             EHL, FHL motor intact             DPN, SPN, TN sensation  Dispo:             Ortho issues addressed for this hospitalization   Ok to resume therapies from ortho standpoint   Mearl Latin, PA-C [phone] (C) 03/21/2023, 10:18 AM  [facility] [address] [place] [address] [phone] [phone] (F)    After 5pm and on the weekends please log on to Amion, go to orthopaedics and the look under the [facility] Call for the provider(s) on call. You can also call our office at [phone] and then follow the prompts to be connected to the call team.  Patient ID: Marge Duncans, male   DOB: 08-Nov-1995, 27 y.o.   MRN: [id number]  Dispo:             Ortho issues addressed for this hospitalization   Ok to resume therapies from ortho standpoint   Mearl Latin, PA-C [phone] (C) 03/21/2023, 10:18 AM  [facility] [address] [place] [address] [phone] [phone] (F)    After 5pm and on the weekends please log on to Amion, go to orthopaedics and the look under the [facility] Call for the provider(s) on call. You can also call our office at [phone] and then follow the prompts to be connected to the call team.  Patient ID: Marge Duncans, male   DOB: 08-Nov-1995, 27 y.o.   MRN: [id number]
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
## 2023-03-23 LAB — BASIC METABOLIC PANEL
Anion gap: 9 (ref 5–15)
BUN: 13 mg/dL (ref 6–20)
CO2: 21 mmol/L — ABNORMAL LOW (ref 22–32)
Calcium: 7.8 mg/dL — ABNORMAL LOW (ref 8.9–10.3)
Chloride: 99 mmol/L (ref 98–111)
Creatinine, Ser: 0.64 mg/dL (ref 0.61–1.24)
GFR, Estimated: >60 mL/min (ref >60)
Glucose, Bld: 136 mg/dL — ABNORMAL HIGH (ref 70–99)
Potassium: 3.3 mmol/L — ABNORMAL LOW (ref 3.5–5.1)
Sodium: 129 mmol/L — ABNORMAL LOW (ref 135–145)

## 2023-03-23 LAB — CBC
HCT: 26.3 % — ABNORMAL LOW (ref 39.0–52.0)
Hemoglobin: 8.6 g/dL — ABNORMAL LOW (ref 13.0–17.0)
MCH: 29.8 pg (ref 26.0–34.0)
MCHC: 32.7 g/dL (ref 30.0–36.0)
MCV: 91 fL (ref 80.0–100.0)
Platelets: 412 10*3/uL — ABNORMAL HIGH (ref 150–400)
RBC: 2.89 MIL/uL — ABNORMAL LOW (ref 4.22–5.81)
RDW: 14.7 % (ref 11.5–15.5)
WBC: 13.1 10*3/uL — ABNORMAL HIGH (ref 4.0–10.5)
nRBC: 1.5 % — ABNORMAL HIGH (ref 0.0–0.2)

## 2023-03-23 MED ORDER — POTASSIUM CHLORIDE CRYS ER 20 MEQ PO TBCR
40.0000 meq | EXTENDED_RELEASE_TABLET | Freq: Once | ORAL | Status: AC
  Administered 2023-03-23: 40 meq via ORAL
  Filled 2023-03-23: qty 2

## 2023-03-23 MED ORDER — GABAPENTIN 400 MG PO CAPS
400.0000 mg | ORAL_CAPSULE | Freq: Three times a day (TID) | ORAL | Status: DC
  Administered 2023-03-23 – 2023-03-28 (×15): 400 mg via ORAL
  Filled 2023-03-23 (×15): qty 1

## 2023-03-23 MED ORDER — POLYETHYLENE GLYCOL 3350 17 G PO PACK
17.0000 g | PACK | Freq: Two times a day (BID) | ORAL | Status: DC
  Administered 2023-03-23 – 2023-03-27 (×9): 17 g via ORAL
  Filled 2023-03-23 (×9): qty 1

## 2023-03-23 MED ORDER — BACLOFEN 10 MG PO TABS
10.0000 mg | ORAL_TABLET | Freq: Three times a day (TID) | ORAL | Status: DC
  Administered 2023-03-23 – 2023-03-24 (×4): 10 mg via ORAL
  Filled 2023-03-23 (×4): qty 1
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
## 2023-03-24 ENCOUNTER — Encounter (HOSPITAL_COMMUNITY): Payer: Self-pay | Admitting: Surgery

## 2023-03-24 LAB — URINALYSIS, ROUTINE W REFLEX MICROSCOPIC
Bilirubin Urine: NEGATIVE
Glucose, UA: NEGATIVE mg/dL
Ketones, ur: NEGATIVE mg/dL
Leukocytes,Ua: NEGATIVE
Nitrite: NEGATIVE
Protein, ur: 30 mg/dL — AB
RBC / HPF: >50 RBC/hpf (ref 0–5)
Specific Gravity, Urine: 1.016 (ref 1.005–1.030)
pH: 8 (ref 5.0–8.0)

## 2023-03-24 LAB — BASIC METABOLIC PANEL
Anion gap: 11 (ref 5–15)
BUN: 10 mg/dL (ref 6–20)
CO2: 22 mmol/L (ref 22–32)
Calcium: 8.2 mg/dL — ABNORMAL LOW (ref 8.9–10.3)
Chloride: 98 mmol/L (ref 98–111)
Creatinine, Ser: 0.61 mg/dL (ref 0.61–1.24)
GFR, Estimated: >60 mL/min (ref >60)
Glucose, Bld: 123 mg/dL — ABNORMAL HIGH (ref 70–99)
Potassium: 4.2 mmol/L (ref 3.5–5.1)
Sodium: 131 mmol/L — ABNORMAL LOW (ref 135–145)

## 2023-03-24 MED ORDER — BACLOFEN 10 MG PO TABS
5.0000 mg | ORAL_TABLET | Freq: Three times a day (TID) | ORAL | Status: DC
  Administered 2023-03-24 – 2023-03-28 (×12): 5 mg via ORAL
  Filled 2023-03-24 (×12): qty 1
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
## 2023-03-24 NOTE — Progress Notes (Addendum)
Wound dressing completed by MD and PA.  This RN present and assisted during dressing change.
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
## 2023-03-24 NOTE — Plan of Care (Signed)

## 2023-03-24 NOTE — Progress Notes (Signed)
Per secure chat from provider, advised to not complete morning dressing change as rounding providers will.

## 2023-03-25 ENCOUNTER — Inpatient Hospital Stay (HOSPITAL_COMMUNITY): Payer: Self-pay

## 2023-03-25 LAB — CBC
HCT: 25.3 % — ABNORMAL LOW (ref 39.0–52.0)
Hemoglobin: 8.6 g/dL — ABNORMAL LOW (ref 13.0–17.0)
MCH: 30.7 pg (ref 26.0–34.0)
MCHC: 34 g/dL (ref 30.0–36.0)
MCV: 90.4 fL (ref 80.0–100.0)
Platelets: 575 10*3/uL — ABNORMAL HIGH (ref 150–400)
RBC: 2.8 MIL/uL — ABNORMAL LOW (ref 4.22–5.81)
RDW: 15.1 % (ref 11.5–15.5)
WBC: 21.8 10*3/uL — ABNORMAL HIGH (ref 4.0–10.5)
nRBC: 0.9 % — ABNORMAL HIGH (ref 0.0–0.2)

## 2023-03-25 MED ORDER — OXYCODONE HCL 5 MG PO TABS
5.0000 mg | ORAL_TABLET | Freq: Two times a day (BID) | ORAL | Status: DC
  Administered 2023-03-25 – 2023-03-28 (×7): 5 mg via ORAL
  Filled 2023-03-25 (×7): qty 1

## 2023-03-25 MED ORDER — HYDROMORPHONE HCL 1 MG/ML IJ SOLN
0.5000 mg | INTRAMUSCULAR | Status: DC | PRN
  Administered 2023-03-25 – 2023-03-28 (×3): 0.5 mg via INTRAVENOUS
  Filled 2023-03-25 (×4): qty 0.5

## 2023-03-25 MED ORDER — BISACODYL 10 MG RE SUPP
10.0000 mg | Freq: Every day | RECTAL | Status: DC | PRN
  Administered 2023-03-25: 10 mg via RECTAL
  Filled 2023-03-25: qty 1

## 2023-03-25 MED ORDER — ORAL CARE MOUTH RINSE: 15.0000 mL | OROMUCOSAL | Status: DC | PRN

## 2023-03-25 MED ORDER — IOHEXOL 350 MG/ML SOLN
75.0000 mL | Freq: Once | INTRAVENOUS | Status: AC | PRN
  Administered 2023-03-25: 75 mL via INTRAVENOUS
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
## 2023-03-25 NOTE — Progress Notes (Signed)
OT Cancellation Note  Patient Details Name: [name] MRN: [id number] DOB: Oct 29, 1995   Cancelled Treatment:    Reason Eval/Treat Not Completed: Other (comment). Awaiting RN for scheduled dressing change. Advised pt to trial bed in chair position this PM to further assess sitting tolerance, in anticipation of using maximove to recliner next session.    Margaretmary Eddy [facility] 03/25/2023, 11:22 AM

## 2023-03-25 NOTE — Progress Notes (Addendum)
PT Cancellation Note  Patient Details Name: [name] MRN: [id number] DOB: 1995-10-12   Cancelled Treatment:    Reason Eval/Treat Not Completed: Medical issues which prohibited therapy. Pt just received suppository upon PT arrival. Pt/RN requesting therapy return at a later time. PT to re-attempt as time allows.  1115 addendum: Awaiting RN for scheduled dressing change. Advised pt to trial bed in chair position this PM to further assess sitting tolerance, in anticipation of using maximove to recliner next session.   Ilda Foil 03/25/2023, 9:28 AM

## 2023-03-25 NOTE — Progress Notes (Signed)
Inpatient Rehab Admissions Coordinator:   I will place an order for CIR consult per our protocol for full assessment.   Estill Dooms, PT, DPT Admissions Coordinator [phone] 03/25/23  9:31 AM

## 2023-03-26 LAB — CBC
HCT: 27.1 % — ABNORMAL LOW (ref 39.0–52.0)
Hemoglobin: 8.8 g/dL — ABNORMAL LOW (ref 13.0–17.0)
MCH: 29.2 pg (ref 26.0–34.0)
MCHC: 32.5 g/dL (ref 30.0–36.0)
MCV: 90 fL (ref 80.0–100.0)
Platelets: 694 10*3/uL — ABNORMAL HIGH (ref 150–400)
RBC: 3.01 MIL/uL — ABNORMAL LOW (ref 4.22–5.81)
RDW: 14.9 % (ref 11.5–15.5)
WBC: 20.9 10*3/uL — ABNORMAL HIGH (ref 4.0–10.5)
nRBC: 0.7 % — ABNORMAL HIGH (ref 0.0–0.2)

## 2023-03-26 MED ORDER — LEVOFLOXACIN 750 MG PO TABS
750.0000 mg | ORAL_TABLET | Freq: Every day | ORAL | Status: DC
  Administered 2023-03-26 – 2023-03-28 (×3): 750 mg via ORAL
  Filled 2023-03-26 (×3): qty 1

## 2023-03-26 MED ORDER — IPRATROPIUM-ALBUTEROL 0.5-2.5 (3) MG/3ML IN SOLN
3.0000 mL | Freq: Four times a day (QID) | RESPIRATORY_TRACT | Status: AC
  Administered 2023-03-26 (×2): 3 mL via RESPIRATORY_TRACT
  Filled 2023-03-26 (×3): qty 3

## 2023-03-26 MED ORDER — GUAIFENESIN ER 600 MG PO TB12
1200.0000 mg | ORAL_TABLET | Freq: Two times a day (BID) | ORAL | Status: DC
  Administered 2023-03-26 – 2023-03-28 (×5): 1200 mg via ORAL
  Filled 2023-03-26 (×5): qty 2
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
## 2023-03-26 NOTE — Plan of Care (Signed)
  Problem: Pain Managment: Goal: General experience of comfort will improve Outcome: Progressing   Problem: Safety: Goal: Ability to remain free from injury will improve Outcome: Progressing   Problem: Skin Integrity: Goal: Risk for impaired skin integrity will decrease Outcome: Progressing   
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
## 2023-03-26 NOTE — Progress Notes (Signed)
Inpatient Rehab Coordinator Note:  I met with patient, his sister, and his aunt, at bedside to discuss CIR recommendations and goals/expectations of CIR stay.  We reviewed 3 hrs/day of therapy, physician follow up, and average length of stay 2 weeks (dependent upon progress) with goals of max assist overall, w/c level.  We discussed goals to be as independent as possible, but main focus on rehab will be caregiver education, given 3/4 limbs NWB.  Pt's sister has resigned from work, given denial for [employer], so that she can provide 24/7 care for him until he recovers.  We discussed cost of care and they are agreeable.  I will also ask for him to be screened for Medicaid.  Plans for dressing change tomorrow with myyriad rep and possibly ready for CIR Friday.    Estill Dooms, PT, DPT Admissions Coordinator [phone] 03/26/23  2:11 PM
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
## 2023-03-27 LAB — CBC
HCT: 26.1 % — ABNORMAL LOW (ref 39.0–52.0)
Hemoglobin: 8.6 g/dL — ABNORMAL LOW (ref 13.0–17.0)
MCH: 30.3 pg (ref 26.0–34.0)
MCHC: 33 g/dL (ref 30.0–36.0)
MCV: 91.9 fL (ref 80.0–100.0)
Platelets: 720 10*3/uL — ABNORMAL HIGH (ref 150–400)
RBC: 2.84 MIL/uL — ABNORMAL LOW (ref 4.22–5.81)
RDW: 15.2 % (ref 11.5–15.5)
WBC: 17.4 10*3/uL — ABNORMAL HIGH (ref 4.0–10.5)
nRBC: 0.6 % — ABNORMAL HIGH (ref 0.0–0.2)
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
## 2023-03-27 NOTE — Plan of Care (Signed)
  Problem: Pain Managment: Goal: General experience of comfort will improve Outcome: Progressing   Problem: Safety: Goal: Ability to remain free from injury will improve Outcome: Progressing   Problem: Skin Integrity: Goal: Risk for impaired skin integrity will decrease Outcome: Progressing   
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
## 2023-03-28 ENCOUNTER — Other Ambulatory Visit: Payer: Self-pay

## 2023-03-28 ENCOUNTER — Inpatient Hospital Stay (HOSPITAL_COMMUNITY): Payer: Self-pay

## 2023-03-28 ENCOUNTER — Encounter (HOSPITAL_COMMUNITY): Payer: Self-pay | Admitting: Physical Medicine and Rehabilitation

## 2023-03-28 ENCOUNTER — Inpatient Hospital Stay (HOSPITAL_COMMUNITY)
Admission: RE | Admit: 2023-03-28 | Discharge: 2023-04-03 | DRG: 463 | Disposition: A | Discharge status: Home / Self Care | Payer: Self-pay | Source: Intra-hospital | Attending: Physical Medicine and Rehabilitation | Admitting: Physical Medicine and Rehabilitation

## 2023-03-28 DIAGNOSIS — J189 Pneumonia, unspecified organism: Secondary | ICD-10-CM | POA: Diagnosis present

## 2023-03-28 DIAGNOSIS — E669 Obesity, unspecified: Secondary | ICD-10-CM | POA: Diagnosis present

## 2023-03-28 DIAGNOSIS — S82402D Unspecified fracture of shaft of left fibula, subsequent encounter for closed fracture with routine healing: Secondary | ICD-10-CM

## 2023-03-28 DIAGNOSIS — S32401D Unspecified fracture of right acetabulum, subsequent encounter for fracture with routine healing: Principal | ICD-10-CM

## 2023-03-28 DIAGNOSIS — Z7409 Other reduced mobility: Secondary | ICD-10-CM | POA: Diagnosis not present

## 2023-03-28 DIAGNOSIS — M7989 Other specified soft tissue disorders: Secondary | ICD-10-CM

## 2023-03-28 DIAGNOSIS — N3289 Other specified disorders of bladder: Secondary | ICD-10-CM | POA: Diagnosis present

## 2023-03-28 DIAGNOSIS — S42301D Unspecified fracture of shaft of humerus, right arm, subsequent encounter for fracture with routine healing: Secondary | ICD-10-CM

## 2023-03-28 DIAGNOSIS — T07XXXA Unspecified multiple injuries, initial encounter: Secondary | ICD-10-CM | POA: Diagnosis present

## 2023-03-28 DIAGNOSIS — K5900 Constipation, unspecified: Secondary | ICD-10-CM | POA: Diagnosis present

## 2023-03-28 DIAGNOSIS — G4700 Insomnia, unspecified: Secondary | ICD-10-CM | POA: Diagnosis present

## 2023-03-28 DIAGNOSIS — I96 Gangrene, not elsewhere classified: Secondary | ICD-10-CM | POA: Diagnosis not present

## 2023-03-28 DIAGNOSIS — S42409D Unspecified fracture of lower end of unspecified humerus, subsequent encounter for fracture with routine healing: Secondary | ICD-10-CM

## 2023-03-28 DIAGNOSIS — S31619A Laceration without foreign body of abdominal wall, unspecified quadrant with penetration into peritoneal cavity, initial encounter: Secondary | ICD-10-CM

## 2023-03-28 DIAGNOSIS — Z91018 Allergy to other foods: Secondary | ICD-10-CM

## 2023-03-28 DIAGNOSIS — T3995XD Adverse effect of unspecified nonopioid analgesic, antipyretic and antirheumatic, subsequent encounter: Secondary | ICD-10-CM

## 2023-03-28 DIAGNOSIS — Z6833 Body mass index (BMI) 33.0-33.9, adult: Secondary | ICD-10-CM

## 2023-03-28 DIAGNOSIS — Z79899 Other long term (current) drug therapy: Secondary | ICD-10-CM

## 2023-03-28 DIAGNOSIS — S3131XD Laceration without foreign body of scrotum and testes, subsequent encounter: Secondary | ICD-10-CM

## 2023-03-28 DIAGNOSIS — Z7901 Long term (current) use of anticoagulants: Secondary | ICD-10-CM

## 2023-03-28 DIAGNOSIS — D62 Acute posthemorrhagic anemia: Secondary | ICD-10-CM | POA: Diagnosis present

## 2023-03-28 DIAGNOSIS — S32810D Multiple fractures of pelvis with stable disruption of pelvic ring, subsequent encounter for fracture with routine healing: Secondary | ICD-10-CM

## 2023-03-28 DIAGNOSIS — S82202D Unspecified fracture of shaft of left tibia, subsequent encounter for closed fracture with routine healing: Secondary | ICD-10-CM

## 2023-03-28 DIAGNOSIS — S53124D Posterior dislocation of right ulnohumeral joint, subsequent encounter: Secondary | ICD-10-CM

## 2023-03-28 DIAGNOSIS — I952 Hypotension due to drugs: Secondary | ICD-10-CM | POA: Diagnosis not present

## 2023-03-28 DIAGNOSIS — S3733XD Laceration of urethra, subsequent encounter: Secondary | ICD-10-CM

## 2023-03-28 DIAGNOSIS — S3289XB Fracture of other parts of pelvis, initial encounter for open fracture: Secondary | ICD-10-CM

## 2023-03-28 DIAGNOSIS — S32402D Unspecified fracture of left acetabulum, subsequent encounter for fracture with routine healing: Secondary | ICD-10-CM

## 2023-03-28 LAB — CBC
HCT: 28.3 % — ABNORMAL LOW (ref 39.0–52.0)
Hemoglobin: 8.9 g/dL — ABNORMAL LOW (ref 13.0–17.0)
MCH: 28.3 pg (ref 26.0–34.0)
MCHC: 31.4 g/dL (ref 30.0–36.0)
MCV: 90.1 fL (ref 80.0–100.0)
Platelets: 460 10*3/uL — ABNORMAL HIGH (ref 150–400)
RBC: 3.14 MIL/uL — ABNORMAL LOW (ref 4.22–5.81)
RDW: 15.4 % (ref 11.5–15.5)
WBC: 13.6 10*3/uL — ABNORMAL HIGH (ref 4.0–10.5)
nRBC: 0.4 % — ABNORMAL HIGH (ref 0.0–0.2)

## 2023-03-28 LAB — CREATININE, SERUM
Creatinine, Ser: 0.58 mg/dL — ABNORMAL LOW (ref 0.61–1.24)
GFR, Estimated: >60 mL/min (ref >60)

## 2023-03-28 MED ORDER — ENOXAPARIN SODIUM 30 MG/0.3ML IJ SOSY
30.0000 mg | PREFILLED_SYRINGE | Freq: Two times a day (BID) | INTRAMUSCULAR | Status: DC
  Administered 2023-03-28 – 2023-03-31 (×7): 30 mg via SUBCUTANEOUS
  Filled 2023-03-28 (×7): qty 0.3

## 2023-03-28 MED ORDER — ACETAMINOPHEN 325 MG PO TABS
325.0000 mg | ORAL_TABLET | ORAL | Status: DC | PRN
  Administered 2023-04-01: 650 mg via ORAL
  Filled 2023-03-28: qty 2

## 2023-03-28 MED ORDER — POLYETHYLENE GLYCOL 3350 17 G PO PACK
17.0000 g | PACK | Freq: Two times a day (BID) | ORAL | Status: DC
  Administered 2023-03-28 – 2023-04-01 (×8): 17 g via ORAL
  Filled 2023-03-28 (×10): qty 1

## 2023-03-28 MED ORDER — BACLOFEN 5 MG HALF TABLET
5.0000 mg | ORAL_TABLET | Freq: Three times a day (TID) | ORAL | Status: DC
  Administered 2023-03-28 – 2023-04-03 (×17): 5 mg via ORAL
  Filled 2023-03-28 (×17): qty 1

## 2023-03-28 MED ORDER — BISACODYL 10 MG RE SUPP: 10.0000 mg | Freq: Every day | RECTAL | Status: DC | PRN

## 2023-03-28 MED ORDER — LEVOFLOXACIN 750 MG PO TABS
750.0000 mg | ORAL_TABLET | Freq: Every day | ORAL | Status: AC
  Administered 2023-03-29 – 2023-03-31 (×3): 750 mg via ORAL
  Filled 2023-03-28 (×3): qty 1

## 2023-03-28 MED ORDER — OXYCODONE HCL 5 MG PO TABS
5.0000 mg | ORAL_TABLET | Freq: Two times a day (BID) | ORAL | Status: DC
  Administered 2023-03-28 – 2023-04-03 (×12): 5 mg via ORAL
  Filled 2023-03-28 (×12): qty 1

## 2023-03-28 MED ORDER — GABAPENTIN 400 MG PO CAPS
400.0000 mg | ORAL_CAPSULE | Freq: Three times a day (TID) | ORAL | Status: DC
  Administered 2023-03-28 – 2023-04-03 (×17): 400 mg via ORAL
  Filled 2023-03-28 (×17): qty 1

## 2023-03-28 MED ORDER — ENOXAPARIN SODIUM 30 MG/0.3ML IJ SOSY: 30.0000 mg | PREFILLED_SYRINGE | Freq: Two times a day (BID) | INTRAMUSCULAR | Status: DC

## 2023-03-28 MED ORDER — OXYCODONE HCL 5 MG PO TABS
10.0000 mg | ORAL_TABLET | ORAL | Status: DC | PRN
  Administered 2023-03-28 – 2023-03-29 (×3): 15 mg via ORAL
  Administered 2023-03-29: 10 mg via ORAL
  Administered 2023-03-30 – 2023-03-31 (×3): 15 mg via ORAL
  Filled 2023-03-28 (×7): qty 3

## 2023-03-28 MED ORDER — DOCUSATE SODIUM 100 MG PO CAPS
100.0000 mg | ORAL_CAPSULE | Freq: Two times a day (BID) | ORAL | Status: DC
  Administered 2023-03-28 – 2023-04-01 (×8): 100 mg via ORAL
  Filled 2023-03-28 (×8): qty 1
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
## 2023-03-28 NOTE — Progress Notes (Signed)
Patient w/ large BM and soiled dressing. Dilaudid given per request for dressing change. Will continue to monitor.

## 2023-03-28 NOTE — Discharge Instructions (Signed)
Inpatient Rehab Discharge Instructions  [name] Discharge date and time: No discharge date for patient encounter.   Activities/Precautions/ Functional Status: Activity: Nonweightbearing bilateral lower extremities/nonweightbearing right upper extremity Diet: regular diet Wound Care: Routine skin checks Functional status:  ___ No restrictions     ___ Walk up steps independently ___ 24/7 supervision/assistance   ___ Walk up steps with assistance ___ Intermittent supervision/assistance  ___ Bathe/dress independently ___ Walk with walker     _x__ Bathe/dress with assistance ___ Walk Independently    ___ Shower independently ___ Walk with assistance    ___ Shower with assistance ___ No alcohol     ___ Return to work/school ________  Special Instructions:  No driving smoking or alcohol  Rinse perineal wound after bowel movements with warm water  Twice daily dressing changes to perineal wound with saline moistened gauze packed into wound and cover with dry dressing  Right lower extremity wound.  Daily dry dressing changes with Adaptic, 4 x 4 and Ace wrap  Prevalon boot wear boot right foot when in bed or sitting in wheelchair  My questions have been answered and I understand these instructions. I will adhere to these goals and the provided educational materials after my discharge from the hospital.  Patient/Caregiver Signature _______________________________ Date __________  Clinician Signature _______________________________________ Date __________  Please bring this form and your medication list with you to all your follow-up doctor's appointments.    Information on my medicine - ELIQUIS (apixaban)  Why was Eliquis prescribed for you? Eliquis was prescribed for you to reduce the risk of blood clots forming after orthopedic surgery.    What do You need to know about Eliquis? Take your Eliquis TWICE DAILY - one tablet in the morning and one tablet in the evening  with or without food.  It would be best to take the dose about the same time each day.  If you have difficulty swallowing the tablet whole please discuss with your pharmacist how to take the medication safely.  Take Eliquis exactly as prescribed by your doctor and DO NOT stop taking Eliquis without talking to the doctor who prescribed the medication.  Stopping without other medication to take the place of Eliquis may increase your risk of developing a clot.  After discharge, you should have regular check-up appointments with your healthcare provider that is prescribing your Eliquis.  What do you do if you miss a dose? If a dose of ELIQUIS is not taken at the scheduled time, take it as soon as possible on the same day and twice-daily administration should be resumed.  The dose should not be doubled to make up for a missed dose.  Do not take more than one tablet of ELIQUIS at the same time.  Important Safety Information A possible side effect of Eliquis is bleeding. You should call your healthcare provider right away if you experience any of the following: Bleeding from an injury or your nose that does not stop. Unusual colored urine (red or dark brown) or unusual colored stools (red or black). Unusual bruising for unknown reasons. A serious fall or if you hit your head (even if there is no bleeding).  Some medicines may interact with Eliquis and might increase your risk of bleeding or clotting while on Eliquis. To help avoid this, consult your healthcare provider or pharmacist prior to using any new prescription or non-prescription medications, including herbals, vitamins, non-steroidal anti-inflammatory drugs (NSAIDs) and supplements.  This website has more information on Eliquis (apixaban): http://www.eliquis.com/eliquis/home

## 2023-03-28 NOTE — Progress Notes (Signed)
Report given to [facility] on 28M. Patient will be transport to [facility]. Family is aware of disposition. All belongings sent with patient.

## 2023-03-28 NOTE — Progress Notes (Signed)
Inpatient Rehab Admissions Coordinator:   I have a bed for this patient to admit to CIR today.  Trauma service in agreement. I will let pt/family know.  TOC aware.   Estill Dooms, PT, DPT Admissions Coordinator [phone] 03/28/23  10:03 AM

## 2023-03-28 NOTE — Progress Notes (Signed)
BLE venous duplex has been completed.    Results can be found under chart review under CV PROC. 03/28/2023 6:21 PM Jesseca Marsch RVT, RDMS
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
## 2023-03-28 NOTE — Progress Notes (Signed)
Inpatient Rehabilitation Admission Medication Review by a Pharmacist  A complete drug regimen review was completed for this patient to identify any potential clinically significant medication issues.  High Risk Drug Classes Is patient taking? Indication by Medication  Antipsychotic No   Anticoagulant Yes Enoxaparin - VTE ppx  Antibiotic Yes Levaquin - pneumonia tx  Opioid Yes Oxycodone - pain  Antiplatelet No   Hypoglycemics/insulin No   Vasoactive Medication No   Chemotherapy No   Other Yes Baclofen - muscle spams Docusate, miralax - constipation Gabapentin - neuropathy     Type of Medication Issue Identified Description of Issue Recommendation(s)  Drug Interaction(s) (clinically significant)     Duplicate Therapy     Allergy     No Medication Administration End Date     Incorrect Dose     Additional Drug Therapy Needed     Significant med changes from prior encounter (inform family/care partners about these prior to discharge). All medications will be new at discharge Communicate medication changes with patient/family at discharge  Other       Clinically significant medication issues were identified that warrant physician communication and completion of prescribed/recommended actions by midnight of the next day:  No  Pharmacist comments: n/a   Time spent performing this drug regimen review (minutes): 20   Thank you for allowing pharmacy to be a part of this patient's care.  Thelma Barge, PharmD Clinical Pharmacist
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
## 2023-03-28 NOTE — Plan of Care (Signed)
  Problem: Education: Goal: Knowledge of General Education information will improve Description: Including pain rating scale, medication(s)/side effects and non-pharmacologic comfort measures Outcome: Adequate for Discharge   

## 2023-03-29 DIAGNOSIS — K5901 Slow transit constipation: Secondary | ICD-10-CM

## 2023-03-29 DIAGNOSIS — T07XXXA Unspecified multiple injuries, initial encounter: Secondary | ICD-10-CM

## 2023-03-29 DIAGNOSIS — T1490XA Injury, unspecified, initial encounter: Secondary | ICD-10-CM

## 2023-03-29 MED ORDER — MELATONIN 5 MG PO TABS
5.0000 mg | ORAL_TABLET | Freq: Every evening | ORAL | Status: DC | PRN
  Filled 2023-03-29: qty 1
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
## 2023-03-29 NOTE — Plan of Care (Signed)
  Problem: RH Balance Goal: LTG: Patient will maintain dynamic sitting balance (OT) Description: LTG:  Patient will maintain dynamic sitting balance with assistance during activities of daily living (OT) Flowsheets (Taken 03/29/2023 1242) LTG: Pt will maintain dynamic sitting balance during ADLs with: Minimal Assistance - Patient > 75%   Problem: RH Eating Goal: LTG Patient will perform eating w/assist, cues/equip (OT) Description: LTG: Patient will perform eating with assist, with/without cues using equipment (OT) Flowsheets (Taken 03/29/2023 1242) LTG: Pt will perform eating with assistance level of: Set up assist    Problem: RH Grooming Goal: LTG Patient will perform grooming w/assist,cues/equip (OT) Description: LTG: Patient will perform grooming with assist, with/without cues using equipment (OT) Flowsheets (Taken 03/29/2023 1242) LTG: Pt will perform grooming with assistance level of: Set up assist    Problem: RH Bathing Goal: LTG Patient will bathe all body parts with assist levels (OT) Description: LTG: Patient will bathe all body parts with assist levels (OT) Flowsheets (Taken 03/29/2023 1242) LTG: Pt will perform bathing with assistance level/cueing: Maximal Assistance - Patient 25 - 49%   Problem: RH Dressing Goal: LTG Patient will perform upper body dressing (OT) Description: LTG Patient will perform upper body dressing with assist, with/without cues (OT). Flowsheets (Taken 03/29/2023 1242) LTG: Pt will perform upper body dressing with assistance level of: Supervision/Verbal cueing   Problem: RH Pre-functional/Other (Specify) Goal: RH LTG OT (Specify) 1 Description: RH LTG OT (Specify) 1 Flowsheets (Taken 03/29/2023 1242) LTG: Other OT (Specify) 1: Pt will be able to direct family members during functional transfer (bed<>w/c) 100% of the time by demonstrating good safety awareness and providing cues where positional changes are needed
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
## 2023-03-29 NOTE — Progress Notes (Signed)
Occupational Therapy Session Note  Patient Details  Name: Tanav Pettijohn MRN: [id number] Date of Birth: 04-06-1996  Today's Date: 03/29/2023 OT Individual Time: 4098-1191 OT Individual Time Calculation (min): 56 min    Short Term Goals: Week 1:  OT Short Term Goal 1 (Week 1): STG = LTG due to ELOS  Skilled Therapeutic Interventions/Progress Updates:  Skilled OT intervention completed with focus on toileting needs & wound care. Pt received upright in bed on bed pan, agreeable to session. No pain reported.  Continent of BM on bed pan. Required +2 assist (1 at hip and 1 holding RLE) to roll > L side with avoidance of using RUE for NWB precaution; good effort from pt. Overall +3 assist needed for toileting steps with 2 people assisting with L side lying position, while OT managed pericare. Per trauma care, pericare is to be completed without wiping due to open wound on L medial thigh/groin/buttock and pt used squirt bottle (from Labor and delivery) on acute but did not have here. OT notified charge nurse of need for similar item for future toileting need but OT improvised with feeding tube size syringe filled with saline and used pressure from that to cleanse and lightly pat periarea with wash cloth. Will need to verify orders but OT relayed instructions to nursing and indicated on safety sheet.   Nurse notified of soiled wound dressing in groin and was present for needed re-packing of wound to prevent infection from stool on the region. +3 assist from L side lying > supine transition. Nurse repacked front groin area while OT provided breathing techniques, distraction and emotional support as pt was un-medicated for this procedure. Rolled with +2 A back to L sidelying, with 3 person stepping in to assist with clearance of buttocks for access of wound. Rethreaded mesh underwear and shorts on R side with +2 A, then +3 transition to supine, +3 assist > half R side lying for OT to donn clothing on L side.  +2 total A to boost towards HOB.  Pt's sister was present and involved as one of the assist, with education provided on body mechanics for herself, infection prevention and safety factors for the above. Pt remained semi upright in bed, with bed alarm on/activated, and with all needs in reach at end of session.   Therapy Documentation Precautions:  Precautions Precautions: Fall, Other (comment) Precaution Comments: suprapubic catheter; external fixators at pelvis; perineal wound Restrictions Weight Bearing Restrictions: Yes RUE Weight Bearing: Non weight bearing RLE Weight Bearing: Non weight bearing LLE Weight Bearing: Non weight bearing Other Position/Activity Restrictions: ex-fix on pelvis     Therapy/Group: Individual Therapy  Melvyn Novas, MS, OTR/L  03/29/2023, 3:54 PM
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
## 2023-03-31 ENCOUNTER — Encounter (HOSPITAL_COMMUNITY): Payer: Self-pay | Admitting: Surgery

## 2023-03-31 LAB — CBC WITH DIFFERENTIAL/PLATELET
Abs Immature Granulocytes: 0.53 10*3/uL — ABNORMAL HIGH (ref 0.00–0.07)
Basophils Absolute: 0.1 10*3/uL (ref 0.0–0.1)
Basophils Relative: 1 %
Eosinophils Absolute: 0.2 10*3/uL (ref 0.0–0.5)
Eosinophils Relative: 2 %
HCT: 27.5 % — ABNORMAL LOW (ref 39.0–52.0)
Hemoglobin: 8.4 g/dL — ABNORMAL LOW (ref 13.0–17.0)
Immature Granulocytes: 5 %
Lymphocytes Relative: 16 %
Lymphs Abs: 1.6 10*3/uL (ref 0.7–4.0)
MCH: 28.4 pg (ref 26.0–34.0)
MCHC: 30.5 g/dL (ref 30.0–36.0)
MCV: 92.9 fL (ref 80.0–100.0)
Monocytes Absolute: 1.1 10*3/uL — ABNORMAL HIGH (ref 0.1–1.0)
Monocytes Relative: 11 %
Neutro Abs: 6.5 10*3/uL (ref 1.7–7.7)
Neutrophils Relative %: 65 %
Platelets: 1008 10*3/uL (ref 150–400)
RBC: 2.96 MIL/uL — ABNORMAL LOW (ref 4.22–5.81)
RDW: 15.1 % (ref 11.5–15.5)
WBC: 10 10*3/uL (ref 4.0–10.5)
nRBC: 0.3 % — ABNORMAL HIGH (ref 0.0–0.2)

## 2023-03-31 LAB — COMPREHENSIVE METABOLIC PANEL
ALT: 49 U/L — ABNORMAL HIGH (ref 0–44)
AST: 31 U/L (ref 15–41)
Albumin: 2.7 g/dL — ABNORMAL LOW (ref 3.5–5.0)
Alkaline Phosphatase: 148 U/L — ABNORMAL HIGH (ref 38–126)
Anion gap: 9 (ref 5–15)
BUN: 15 mg/dL (ref 6–20)
CO2: 22 mmol/L (ref 22–32)
Calcium: 8.7 mg/dL — ABNORMAL LOW (ref 8.9–10.3)
Chloride: 99 mmol/L (ref 98–111)
Creatinine, Ser: 0.5 mg/dL — ABNORMAL LOW (ref 0.61–1.24)
GFR, Estimated: >60 mL/min (ref >60)
Glucose, Bld: 96 mg/dL (ref 70–99)
Potassium: 4.1 mmol/L (ref 3.5–5.1)
Sodium: 130 mmol/L — ABNORMAL LOW (ref 135–145)
Total Bilirubin: 1.1 mg/dL (ref 0.3–1.2)
Total Protein: 7 g/dL (ref 6.5–8.1)

## 2023-03-31 NOTE — Progress Notes (Signed)
Inpatient Rehabilitation  Patient information reviewed and entered into eRehab system by Shamila Lerch Mardell Cragg, OTR/L, Rehab Quality Coordinator.   Information including medical coding, functional ability and quality indicators will be reviewed and updated through discharge.   
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
## 2023-03-31 NOTE — Progress Notes (Signed)
Physical Therapy Session Note  Patient Details  Name: [name] MRN: [id number] Date of Birth: [birth year]  Today's Date: 03/31/2023 PT Individual Time: 9485-4627 and 0350-0938 PT Individual Time Calculation (min): 69 min and 56 min  Short Term Goals: Week 1:  PT Short Term Goal 1 (Week 1): = LTGS due to ELOS. Primary focus is family education for transfers and bed mobility  Skilled Therapeutic Interventions/Progress Updates:   Treatment Session 1 Received pt semi-reclined in bed with sister and father present for family education training. Pt agreeable to PT treatment, and reported pain up to 2/10 in groin and hip with mobility - repositioning and rest breaks done to manage pain. Session with emphasis on functional mobility/transfers with NWB precautions, discharge planning, and caregiver education.   Pt verbalized "therapy is not for me right now" due to current limitations; explained current focus of rehab sessions is caregiver training with transfers to ensure safety upon D/C - pt in agreement. Discussed that [name] transfers are safest option for pt and decrease caregiver burden at this time. Located Recruitment consultant and educated pt's sister and father on the following: -functions/management of lift (knob to raise/lower lift, how to extend legs, location of brake) -positioning of sling, crossing sling between legs, and hooking sling on tightest loop -hand placement on sling when guiding pt into WC  Donned scrub top dependently and pt able to roll L/R with max A using bedrails with therapist, then sister, holding each LE up and pt providing good effort to assist with rolling. Pt transferred bed<>recline back WC dependently with +2-+4 assist (for education purposes, could have completed with +2 assist). Placed WC next to [facility] and performed lateral transfer into WC due to inability to bend knees. Educated pt's family on WC parts management including donning/doffing elevating legrests and  recline feature of WC.   Reached out to MD and CSW via secure chat to get pt/family's questions regarding Medicaid, equipment, grounds pass, and wound care plan addressed and provided pt/family with handout with foods to help manage pain and anxiety and promote wound healing. Concluded session with pt semi-reclined in recline back WC with all needs within reach and family present at bedside.   Treatment Session 2 Received pt semi-reclined in bed with sister, [name], present at bedside. Pt agreeable to PT treatment and did not state pain level during session. Session with emphasis on functional mobility/transfers with NWB precautions, D/C planning, and family education. Plan for pt to D/C on Thursday with primary focus on family education - therefore emphasis on session on [place] transfers with [name] and pt directing care. Pt rolled L/R with mod/max A to hold LE with pt using bedrails to roll. Brenda placed [employer] sling and pillow placed along groin for comfort. Pt transferred to/from recline back WC dependently with +2 assist and pt/Brenda directing therapist and rehab tech with steps - only required 2 cues (for catheter placement and to lock WC brakes) and demonstrated excellent carry over with education from AM session.   Discussed ordering hospital bed and air mattress online as well as recline back WC due to financial concerns - looked on [employer] for options and notified CSW of current equipment updates. Returned to bed and concluded session with pt semi-reclined in bed with all needs within reach. Sister, [name], cleared to lead [name] transfers with staff present in room to provide +2 assist as needed. Safety plan and RN updated.   Therapy Documentation Precautions:  Precautions Precautions: Fall, Other (comment) Precaution Comments: suprapubic catheter;  external fixators at pelvis; perineal wound Required Braces or Orthoses: Splint/Cast Splint/Cast: RUE and LLE Restrictions Weight Bearing  Restrictions: Yes RUE Weight Bearing: Non weight bearing RLE Weight Bearing: Non weight bearing LLE Weight Bearing: Non weight bearing Other Position/Activity Restrictions: ex-fix on pelvis  Therapy/Group: Individual Therapy [name] PT, DPT 03/31/2023, 7:07 AM
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
## 2023-03-31 NOTE — Progress Notes (Signed)
Patient ID: [name], male   DOB: 1996-02-20, 27 y.o.   MRN: [id number]  Patient and family will complete education this week. Discharge planned for Thursday 7/4.
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
## 2023-03-31 NOTE — Progress Notes (Signed)
Occupational Therapy Session Note  Patient Details  Name: Vernis Biagioni MRN: [id number] Date of Birth: May 01, 1996  {CHL IP REHAB OT TIME CALCULATIONS:[id number]}   Short Term Goals: Week 1:  OT Short Term Goal 1 (Week 1): STG = LTG due to ELOS  Skilled Therapeutic Interventions/Progress Updates:    Patient agreeable to participate in OT session. Reports *** pain level.   Patient participated in skilled OT session focusing on ***. Therapist facilitated/assessed/developed/educated/integrated/elicited *** in order to improve/facilitate/promote    Therapy Documentation Precautions:  Precautions Precautions: Fall, Other (comment) Precaution Comments: suprapubic catheter; external fixators at pelvis; perineal wound Required Braces or Orthoses: Splint/Cast Splint/Cast: RUE and LLE Restrictions Weight Bearing Restrictions: Yes RUE Weight Bearing: Non weight bearing RLE Weight Bearing: Non weight bearing LLE Weight Bearing: Non weight bearing Other Position/Activity Restrictions: ex-fix on pelvis  Therapy/Group: Individual Therapy  Limmie Patricia, OTR/L,CBIS  Supplemental OT - [facility] and [facility] Secure Chat Preferred   03/31/2023, 6:33 PM

## 2023-03-31 NOTE — Progress Notes (Signed)
Patient ID: [name], male   DOB: 03-03-1996, 27 y.o.   MRN: [id number] Met with the patient and sister to review current situation, rehab process, team conference and plan of care. Discussed skin care, pin care, dietary modifications and wound care. Continue to follow along to address educational needs to facilitate preparation for discharge home. Pamelia Hoit

## 2023-03-31 NOTE — Progress Notes (Signed)
Date and time results received: 03/31/23 0715  Test: platelets Critical Value: 1008  Name of Provider Notified: Deatra Ina, PA  Orders Received? Or Actions Taken?: No new orders at this time. monitoring.  Marylu Lund, RN

## 2023-03-31 NOTE — Progress Notes (Signed)
Occupational Therapy Session Note  Patient Details  Name: [name] MRN: [id number] Date of Birth: 1995-12-19  Today's Date: 03/31/2023 OT Individual Time: 1000-1115 OT Individual Time Calculation (min): 75 min    Short Term Goals: Week 1:  OT Short Term Goal 1 (Week 1): STG = LTG due to ELOS  Skilled Therapeutic Interventions/Progress Updates:  Skilled OT intervention completed with focus on hoyer transfers, toileting needs, education on positioning for toileting and wound care. Pt received reclined in w/c, agreeable to session. Discomfort only reported during packing of wound; OT offered repositioning, distraction, breathing technique for pain reduction.  NT requested OT assist to transfer pt to bed for urgent need with BM. Pt already with hoyer sling underneath him, however adjustments needed to prevent bunching of chuck pad during hoyer transfer. Pt's sister managed all components on the hoyer lift, with min cues needed from therapist. OT suggested use of 2nd hook on leg part of sling to reduce pressure/pain while in the lift as pt reported this was most painful part on first trial this AM.  Dependent transfer in hoyer with sister managing all levers and transport of hoyer, OT assisting with LLE and catheter management and 3rd person used for removal of w/c and prep of bed placement due to environmental limitations. Pt tolerated transfer to bed well with no pain.  Sister was involved hands on, however OT directed the care for toileting positioning and sequencing to minimize pain and derive a plan for what method will best for pt/sister at home. 3 people needed at all times (1 for leg and catheter management, 2nd person for pelvic stability/assist, 3rd person managing periarea/wound care/clothing).  Pt rolled R<>L for removal of LB clothing, then OT placed bed pan while pt was in R sidelying. Continent of loose BM with time and privacy provided. Completed pericare with via spray bottle  for irrigation while alternating saline and vashe solution, then gently patting to clean/dry per trauma recommendation. Time needed to access all crevices to prevent infection.  Nurse notified of soiled wound dressing in groin and opportunity while pt was in comfortable position for repacking of dressing while OT present to assist. Nurse repacked front groin area while OT provided breathing techniques, distraction and emotional support as pt was un-medicated for this procedure by choice though pt tolerated well and denied pain. Rethreaded mesh underwear and shorts along with chuck pads. +2 total A to boost towards HOB. Pt remained semi upright in bed, with bed alarm on/activated, and with all needs in reach at end of session.   Therapy Documentation Precautions:  Precautions Precautions: Fall, Other (comment) Precaution Comments: suprapubic catheter; external fixators at pelvis; perineal wound Required Braces or Orthoses: Splint/Cast Splint/Cast: RUE and LLE Restrictions Weight Bearing Restrictions: Yes RUE Weight Bearing: Non weight bearing RLE Weight Bearing: Non weight bearing LLE Weight Bearing: Non weight bearing Other Position/Activity Restrictions: ex-fix on pelvis    Therapy/Group: Individual Therapy  Melvyn Novas, MS, OTR/L  03/31/2023, 12:20 PM

## 2023-03-31 NOTE — Progress Notes (Signed)
Inpatient Rehabilitation Center Individual Statement of Services  Patient Name:  [name]  Date:  03/31/2023  Welcome to the Inpatient Rehabilitation Center.  Our goal is to provide you with an individualized program based on your diagnosis and situation, designed to meet your specific needs.  With this comprehensive rehabilitation program, you will be expected to participate in at least 3 hours of rehabilitation therapies Monday-Friday, with modified therapy programming on the weekends.  Your rehabilitation program will include the following services:  Physical Therapy (PT), Occupational Therapy (OT), Speech Therapy (ST), 24 hour per day rehabilitation nursing, Therapeutic Recreaction (TR), Neuropsychology, Care Coordinator, Rehabilitation Medicine, Nutrition Services, Pharmacy Services, and Other  Weekly team conferences will be held on Wednesdays to discuss your progress.  Your Inpatient Rehabilitation Care Coordinator will talk with you frequently to get your input and to update you on team discussions.  Team conferences with you and your family in attendance may also be held.  Expected length of stay: 14-18 Days  Overall anticipated outcome: PT max assist +1 for transfers and min assist for w/c mobility; OT set up grooming/feeding, mod assist bathing/dressing   Depending on your progress and recovery, your program may change. Your Inpatient Rehabilitation Care Coordinator will coordinate services and will keep you informed of any changes. Your Inpatient Rehabilitation Care Coordinator's name and contact numbers are listed  below.  The following services may also be recommended but are not provided by the Inpatient Rehabilitation Center:   Home Health Rehabiltiation Services Outpatient Rehabilitation Services    Arrangements will be made to provide these services after discharge if needed.  Arrangements include referral to agencies that provide these services.  Your insurance has  been verified to be:  unisured Your primary doctor is:  No PCP  Pertinent information will be shared with your doctor and your insurance company.  Inpatient Rehabilitation Care Coordinator:  Lavera Guise, [place] [phone] or [phone]  Information discussed with and copy given to patient by: Andria Rhein, 03/31/2023, 9:23 AM

## 2023-04-01 DIAGNOSIS — Z9289 Personal history of other medical treatment: Secondary | ICD-10-CM

## 2023-04-01 HISTORY — DX: Personal history of other medical treatment: Z92.89

## 2023-04-01 LAB — CBC WITH DIFFERENTIAL/PLATELET
Abs Immature Granulocytes: 0.39 10*3/uL — ABNORMAL HIGH (ref 0.00–0.07)
Basophils Absolute: 0.1 10*3/uL (ref 0.0–0.1)
Basophils Relative: 1 %
Eosinophils Absolute: 0.2 10*3/uL (ref 0.0–0.5)
Eosinophils Relative: 2 %
HCT: 27.3 % — ABNORMAL LOW (ref 39.0–52.0)
Hemoglobin: 8.5 g/dL — ABNORMAL LOW (ref 13.0–17.0)
Immature Granulocytes: 5 %
Lymphocytes Relative: 22 %
Lymphs Abs: 1.8 10*3/uL (ref 0.7–4.0)
MCH: 28.7 pg (ref 26.0–34.0)
MCHC: 31.1 g/dL (ref 30.0–36.0)
MCV: 92.2 fL (ref 80.0–100.0)
Monocytes Absolute: 1 10*3/uL (ref 0.1–1.0)
Monocytes Relative: 12 %
Neutro Abs: 5 10*3/uL (ref 1.7–7.7)
Neutrophils Relative %: 58 %
Platelets: 1035 10*3/uL (ref 150–400)
RBC: 2.96 MIL/uL — ABNORMAL LOW (ref 4.22–5.81)
RDW: 15.1 % (ref 11.5–15.5)
WBC: 8.4 10*3/uL (ref 4.0–10.5)
nRBC: 0.5 % — ABNORMAL HIGH (ref 0.0–0.2)

## 2023-04-01 MED ORDER — POLYETHYLENE GLYCOL 3350 17 G PO PACK: 17.0000 g | PACK | Freq: Two times a day (BID) | ORAL | 0 refills | Status: DC

## 2023-04-01 MED ORDER — OXYCODONE HCL 5 MG PO TABS
10.0000 mg | ORAL_TABLET | ORAL | Status: DC | PRN
  Administered 2023-04-01 – 2023-04-03 (×3): 10 mg via ORAL
  Filled 2023-04-01 (×4): qty 2

## 2023-04-01 MED ORDER — APIXABAN 2.5 MG PO TABS: 2.5000 mg | ORAL_TABLET | Freq: Two times a day (BID) | ORAL | Status: DC

## 2023-04-01 MED ORDER — ENOXAPARIN SODIUM 30 MG/0.3ML IJ SOSY
30.0000 mg | PREFILLED_SYRINGE | Freq: Two times a day (BID) | INTRAMUSCULAR | Status: DC
  Administered 2023-04-01 – 2023-04-03 (×4): 30 mg via SUBCUTANEOUS
  Filled 2023-04-01 (×5): qty 0.3

## 2023-04-01 MED ORDER — OXYCODONE HCL 5 MG PO TABS: 10.0000 mg | ORAL_TABLET | Freq: Once | ORAL | Status: DC

## 2023-04-01 MED ORDER — ACETAMINOPHEN 325 MG PO TABS: 325.0000 mg | ORAL_TABLET | ORAL | Status: DC | PRN

## 2023-04-01 MED ORDER — DOCUSATE SODIUM 100 MG PO CAPS
100.0000 mg | ORAL_CAPSULE | Freq: Every day | ORAL | Status: DC
  Administered 2023-04-02 – 2023-04-03 (×2): 100 mg via ORAL
  Filled 2023-04-01 (×2): qty 1
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
## 2023-04-01 NOTE — Progress Notes (Signed)
     Subjective: 7/2: Pt in good spirits. Accompanied by his wife. Reviewed plan with them and questions were answered to their satisfaction.   Objective: Vital signs in last 24 hours: Temp:  [97.8 F (36.6 C)-100.2 F (37.9 C)] 97.8 F (36.6 C) (07/02 0518) Pulse Rate:  [88-101] 88 (07/02 0518) Resp:  [16] 16 (07/02 0518) BP: (110-121)/(69-75) 110/69 (07/02 0518) SpO2:  [98 %-99 %] 98 % (07/02 0518)  Intake/Output from previous day: 07/01 0701 - 07/02 0700 In: -  Out: 2850 [Urine:2850]  Intake/Output this shift: Total I/O In: -  Out: 1100 [Urine:1100]  Physical Exam:  General: Alert and oriented CV: No cyanosis Lungs: equal chest rise Abdomen: Soft, NTND, no rebound or guarding Gu: SPT in place draining clear yellow urine  Lab Results: Recent Labs    03/31/23 0621 04/01/23 0550  HGB 8.4* 8.5*  HCT 27.5* 27.3*   BMET Recent Labs    03/31/23 0621  NA 130*  K 4.1  CL 99  CO2 22  GLUCOSE 96  BUN 15  CREATININE 0.50*  CALCIUM 8.7*     Studies/Results: No results found.  Assessment/Plan: Assessment/Plan: #Urethral injury - Significant urethral injury, likely full disruption, just proximal to the bulbar urethra - Diverted with cutdown SPT. Staples removed from midline and RLQ on 7/2. - Will require delayed definitive repair with urinary diversion via SPT in the interim. Referral to [facility] for evaluation with Dr. Guy Sandifer (recon). Confirmed with pt he would like to go here   #Bladder injury - S/p open repair - As above, SPT in place - Continue SPT to drainage. Gently irrigate with 50cc sterile saline via 50cc catheter-tipped syringe PRN for sluggish drainage - Will require monthly SPT exchanges until urethral repair. To be scheduled for clincic   #Perineal and scrotal laceration - wet to dry dressings - accompanied general surgery again on 7/2. Shared decision was made to postpone removal of sutures and myriad sheets at bedside since Plastics was  taking him back in a couple of days.    LOS: 4 days   Carl Kirschner, NP [facility] Pager: [phone]  04/01/2023, 11:35 AM
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
## 2023-04-01 NOTE — Progress Notes (Signed)
Patient ID: [name], male   DOB: [birth year], 27 y.o.   MRN: [id number]  Wheelchair and [name] ordered through [employer].
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
## 2023-04-01 NOTE — Progress Notes (Signed)
Occupational Therapy Session Note  Patient Details  Name: Jensyn Stoneburner MRN: [id number] Date of Birth: 1996/03/17  Today's Date: 04/01/2023 OT Individual Time: 4098-1191 OT Individual Time Calculation (min): 70 min    Short Term Goals: Week 1:  OT Short Term Goal 1 (Week 1): STG = LTG due to ELOS  Skilled Therapeutic Interventions/Progress Updates:  Skilled OT intervention completed with focus on problem solving OOB toilet transfers, positioning and pericare. Pt received semi upright in bed with sister present, agreeable to session. Intermittent pain (unrated) reported in Lt groin wound; declined meds. Did report "bubbling sensation" at base of penis; encouraged pt to ask trauma/wound care about this but MD aware. OT offered rest breaks and repositioning throughout for pain reduction.   Pt is unable to tolerate or functionally sit at 90 degrees due to pelvic external fixator length and core weakness, therefore OT suggested recline back shower chair like his w/c to accommodate less hip flexion while still on functional commode.   Hoyer unavailable at time of session therefore utilized maxi move for dependent transfer. Donned maxi-move sling with pt able to roll R<>L using bed rails with +1 only for either leg management, then 2nd person used for placement of sling. Dependent lift in maximove to transition pt from supine/parallel with bed > perpendicular in bed in order to support BLE on bed while using maximove to back/place pt over recline back shower chair that was positioning up next to EOB as if preparing to do posterior transfer. 2 people needed for safety with managing lowering of the maximove sling next to external fixator, and 1 for positioning hips on chair. +2 utilized to transition pt back to supine in bed in the reverse order of above. Sling removed with +1 assist with pt rolling himself but assist only for leg management and removal of sling.  Discussed that caregivers would need to  doff LB clothing prior to sling donning and transfer for ease with toileting steps while in sling on the chair, reviewed irrigation steps and use of BSC bucket for catching stool and then transitioning back to bed for additional pericare/woundcare and LB dressing for efficiency. MD present for rounds, and also notified MD and pt of need for reduced stool softeners to firm his stool from liquid consistency that highly increases his risk of cross contamination during toileting needs as wound is very close to perineal region. Pt in agreement to reducing the stool meds.  Discussed that shower chair would be OOP; handout issued to pt and sister. Sister will need to complete hands on for management of DME and with a hoyer vs maximove if prepping to use this device. Sister aware. Pt remained semi upright in bed in prep for upcoming wound care/trauma appointment, with bed alarm on/activated, and with all needs in reach at end of session.   Therapy Documentation Precautions:  Precautions Precautions: Fall, Other (comment) Precaution Comments: suprapubic catheter; external fixators at pelvis; perineal wound Required Braces or Orthoses: Splint/Cast Splint/Cast: RUE and LLE Restrictions Weight Bearing Restrictions: Yes RUE Weight Bearing: Non weight bearing RLE Weight Bearing: Non weight bearing LLE Weight Bearing: Non weight bearing Other Position/Activity Restrictions: ex-fix on pelvis    Therapy/Group: Individual Therapy  Melvyn Novas, MS, OTR/L  04/01/2023, 12:57 PM

## 2023-04-02 ENCOUNTER — Inpatient Hospital Stay (HOSPITAL_COMMUNITY): Payer: Self-pay | Admitting: Anesthesiology

## 2023-04-02 ENCOUNTER — Inpatient Hospital Stay (HOSPITAL_COMMUNITY): Admission: RE | Admit: 2023-04-02 | Payer: Self-pay | Source: Home / Self Care | Admitting: Plastic Surgery

## 2023-04-02 ENCOUNTER — Encounter (HOSPITAL_COMMUNITY)
Admission: RE | Disposition: A | Discharge status: Home / Self Care | Payer: Self-pay | Source: Intra-hospital | Attending: Physical Medicine and Rehabilitation

## 2023-04-02 ENCOUNTER — Encounter (HOSPITAL_COMMUNITY): Payer: Self-pay | Admitting: Physical Medicine and Rehabilitation

## 2023-04-02 ENCOUNTER — Other Ambulatory Visit (HOSPITAL_COMMUNITY): Payer: Self-pay

## 2023-04-02 ENCOUNTER — Other Ambulatory Visit: Payer: Self-pay

## 2023-04-02 DIAGNOSIS — S31501D Unspecified open wound of unspecified external genital organs, male, subsequent encounter: Secondary | ICD-10-CM

## 2023-04-02 DIAGNOSIS — E669 Obesity, unspecified: Secondary | ICD-10-CM

## 2023-04-02 DIAGNOSIS — Z6833 Body mass index (BMI) 33.0-33.9, adult: Secondary | ICD-10-CM

## 2023-04-02 DIAGNOSIS — S31501A Unspecified open wound of unspecified external genital organs, male, initial encounter: Secondary | ICD-10-CM

## 2023-04-02 HISTORY — PX: DEBRIDEMENT AND CLOSURE WOUND: SHX5614

## 2023-04-02 SURGERY — DEBRIDEMENT, WOUND, WITH CLOSURE
Anesthesia: General | Site: Perineum

## 2023-04-02 SURGERY — DEBRIDEMENT, WOUND, WITH CLOSURE
Anesthesia: General

## 2023-04-02 MED ORDER — CHLORHEXIDINE GLUCONATE 0.12 % MT SOLN: 15.0000 mL | Freq: Once | OROMUCOSAL | Status: AC

## 2023-04-02 MED ORDER — MIDAZOLAM HCL 2 MG/2ML IJ SOLN
INTRAMUSCULAR | Status: DC | PRN
  Administered 2023-04-02: 2 mg via INTRAVENOUS

## 2023-04-02 MED ORDER — CHLORHEXIDINE GLUCONATE 0.12 % MT SOLN
OROMUCOSAL | Status: AC
  Administered 2023-04-02: 15 mL via OROMUCOSAL
  Filled 2023-04-02: qty 15

## 2023-04-02 MED ORDER — ONDANSETRON HCL 4 MG/2ML IJ SOLN
INTRAMUSCULAR | Status: DC | PRN
  Administered 2023-04-02: 4 mg via INTRAVENOUS

## 2023-04-02 MED ORDER — DEXAMETHASONE SODIUM PHOSPHATE 10 MG/ML IJ SOLN
INTRAMUSCULAR | Status: DC | PRN
  Administered 2023-04-02: 4 mg via INTRAVENOUS

## 2023-04-02 MED ORDER — CEFAZOLIN SODIUM-DEXTROSE 2-4 GM/100ML-% IV SOLN
2.0000 g | INTRAVENOUS | Status: AC
  Administered 2023-04-02: 2 g via INTRAVENOUS

## 2023-04-02 MED ORDER — CEFAZOLIN SODIUM-DEXTROSE 2-4 GM/100ML-% IV SOLN
INTRAVENOUS | Status: AC
  Filled 2023-04-02: qty 100

## 2023-04-02 MED ORDER — OXYCODONE HCL 10 MG PO TABS
10.0000 mg | ORAL_TABLET | ORAL | 0 refills | Status: DC | PRN
  Filled 2023-04-02: qty 30, 5d supply, fill #0

## 2023-04-02 MED ORDER — BACLOFEN 5 MG PO TABS
5.0000 mg | ORAL_TABLET | Freq: Three times a day (TID) | ORAL | 0 refills | Status: DC
  Filled 2023-04-02: qty 90, 30d supply, fill #0

## 2023-04-02 MED ORDER — KETAMINE HCL 10 MG/ML IJ SOLN
INTRAMUSCULAR | Status: DC | PRN
  Administered 2023-04-02: 50 mg via INTRAVENOUS

## 2023-04-02 MED ORDER — SUCCINYLCHOLINE CHLORIDE 200 MG/10ML IV SOSY
PREFILLED_SYRINGE | INTRAVENOUS | Status: AC
  Filled 2023-04-02: qty 10

## 2023-04-02 MED ORDER — MIDAZOLAM HCL 2 MG/2ML IJ SOLN
INTRAMUSCULAR | Status: AC
  Filled 2023-04-02: qty 2

## 2023-04-02 MED ORDER — FENTANYL CITRATE (PF) 250 MCG/5ML IJ SOLN
INTRAMUSCULAR | Status: DC | PRN
  Administered 2023-04-02 (×2): 50 ug via INTRAVENOUS
  Administered 2023-04-02: 100 ug via INTRAVENOUS
  Administered 2023-04-02: 50 ug via INTRAVENOUS

## 2023-04-02 MED ORDER — ROCURONIUM BROMIDE 10 MG/ML (PF) SYRINGE
PREFILLED_SYRINGE | INTRAVENOUS | Status: AC
  Filled 2023-04-02: qty 10

## 2023-04-02 MED ORDER — ORAL CARE MOUTH RINSE: 15.0000 mL | Freq: Once | OROMUCOSAL | Status: AC

## 2023-04-02 MED ORDER — KETAMINE HCL 50 MG/5ML IJ SOSY
PREFILLED_SYRINGE | INTRAMUSCULAR | Status: AC
  Filled 2023-04-02: qty 5

## 2023-04-02 MED ORDER — HYDROMORPHONE HCL 1 MG/ML IJ SOLN
INTRAMUSCULAR | Status: DC | PRN
  Administered 2023-04-02: 1 mg via INTRAVENOUS

## 2023-04-02 MED ORDER — HYDROMORPHONE HCL 1 MG/ML IJ SOLN
INTRAMUSCULAR | Status: AC
  Filled 2023-04-02: qty 0.5

## 2023-04-02 MED ORDER — LIDOCAINE 2% (20 MG/ML) 5 ML SYRINGE
INTRAMUSCULAR | Status: AC
  Filled 2023-04-02: qty 5

## 2023-04-02 MED ORDER — ACETAMINOPHEN 10 MG/ML IV SOLN
INTRAVENOUS | Status: AC
  Filled 2023-04-02: qty 100

## 2023-04-02 MED ORDER — GABAPENTIN 400 MG PO CAPS
400.0000 mg | ORAL_CAPSULE | Freq: Three times a day (TID) | ORAL | 0 refills | Status: DC
  Filled 2023-04-02: qty 90, 30d supply, fill #0

## 2023-04-02 MED ORDER — ONDANSETRON HCL 4 MG/2ML IJ SOLN
INTRAMUSCULAR | Status: AC
  Filled 2023-04-02: qty 4

## 2023-04-02 MED ORDER — CALCIUM CHLORIDE 10 % IV SOLN
INTRAVENOUS | Status: AC
  Filled 2023-04-02: qty 20

## 2023-04-02 MED ORDER — CHLORHEXIDINE GLUCONATE CLOTH 2 % EX PADS: 6.0000 | MEDICATED_PAD | Freq: Once | CUTANEOUS | Status: DC

## 2023-04-02 MED ORDER — FENTANYL CITRATE (PF) 250 MCG/5ML IJ SOLN
INTRAMUSCULAR | Status: AC
  Filled 2023-04-02: qty 5

## 2023-04-02 MED ORDER — APIXABAN 2.5 MG PO TABS
2.5000 mg | ORAL_TABLET | Freq: Two times a day (BID) | ORAL | 0 refills | Status: DC
  Filled 2023-04-02: qty 60, 30d supply, fill #0

## 2023-04-02 MED ORDER — DEXAMETHASONE SODIUM PHOSPHATE 10 MG/ML IJ SOLN
INTRAMUSCULAR | Status: AC
  Filled 2023-04-02: qty 3

## 2023-04-02 MED ORDER — PROPOFOL 10 MG/ML IV BOLUS
INTRAVENOUS | Status: DC | PRN
  Administered 2023-04-02: 200 mg via INTRAVENOUS

## 2023-04-02 MED ORDER — LIDOCAINE 2% (20 MG/ML) 5 ML SYRINGE
INTRAMUSCULAR | Status: DC | PRN
  Administered 2023-04-02: 100 mg via INTRAVENOUS

## 2023-04-02 MED ORDER — LACTATED RINGERS IV SOLN: INTRAVENOUS | Status: DC

## 2023-04-02 MED ORDER — ACETAMINOPHEN 10 MG/ML IV SOLN
INTRAVENOUS | Status: DC | PRN
  Administered 2023-04-02: 1000 mg via INTRAVENOUS

## 2023-04-02 MED ORDER — DOCUSATE SODIUM 100 MG PO CAPS: 100.0000 mg | ORAL_CAPSULE | Freq: Every day | ORAL | 0 refills | Status: DC

## 2023-04-02 SURGICAL SUPPLY — 65 items
ADH SKN CLS APL DERMABOND .7 (GAUZE/BANDAGES/DRESSINGS)
BAG COUNTER SPONGE SURGICOUNT (BAG) IMPLANT
BAG SPNG CNTER NS LX DISP (BAG)
BINDER BREAST 3XL (GAUZE/BANDAGES/DRESSINGS) IMPLANT
BINDER BREAST LRG (GAUZE/BANDAGES/DRESSINGS) IMPLANT
BINDER BREAST MEDIUM (GAUZE/BANDAGES/DRESSINGS) IMPLANT
BINDER BREAST XLRG (GAUZE/BANDAGES/DRESSINGS) IMPLANT
BINDER BREAST XXLRG (GAUZE/BANDAGES/DRESSINGS) IMPLANT
BLADE CLIPPER SURG (BLADE) IMPLANT
BLADE HEX COATED 2.75 (ELECTRODE) ×1 IMPLANT
BLADE SURG 10 STRL SS (BLADE) IMPLANT
BLADE SURG 15 STRL LF DISP TIS (BLADE) ×1 IMPLANT
BLADE SURG 15 STRL SS (BLADE) ×1
BNDG GAUZE DERMACEA FLUFF 4 (GAUZE/BANDAGES/DRESSINGS) IMPLANT
BNDG GZE DERMACEA 4 6PLY (GAUZE/BANDAGES/DRESSINGS)
CLEANSER WND VASHE 34 (WOUND CARE) IMPLANT
COVER BACK TABLE 60X90IN (DRAPES) ×1 IMPLANT
COVER MAYO STAND STRL (DRAPES) ×1 IMPLANT
DERMABOND ADVANCED .7 DNX12 (GAUZE/BANDAGES/DRESSINGS) IMPLANT
DRAIN CHANNEL 19F RND (DRAIN) IMPLANT
DRAPE CHEST BREAST 15X10 FENES (DRAPES) IMPLANT
DRAPE INCISE IOBAN 66X45 STRL (DRAPES) IMPLANT
DRAPE LAPAROTOMY 100X72 PEDS (DRAPES) IMPLANT
DRAPE SURG 17X23 STRL (DRAPES) IMPLANT
DRAPE U-SHAPE 76X120 STRL (DRAPES) IMPLANT
DRSG ADAPTIC 3X8 NADH LF (GAUZE/BANDAGES/DRESSINGS) IMPLANT
DRSG EMULSION OIL 3X3 NADH (GAUZE/BANDAGES/DRESSINGS) IMPLANT
DRSG HYDROCOLLOID 4X4 (GAUZE/BANDAGES/DRESSINGS) IMPLANT
DRSG TEGADERM 4X4.75 (GAUZE/BANDAGES/DRESSINGS) IMPLANT
DRSG TELFA 3X8 NADH STRL (GAUZE/BANDAGES/DRESSINGS) IMPLANT
ELECT REM PT RETURN 9FT ADLT (ELECTROSURGICAL) ×1
ELECTRODE REM PT RTRN 9FT ADLT (ELECTROSURGICAL) ×1 IMPLANT
EVACUATOR SILICONE 100CC (DRAIN) IMPLANT
GAUZE PAD ABD 8X10 STRL (GAUZE/BANDAGES/DRESSINGS) IMPLANT
GAUZE SPONGE 4X4 12PLY STRL (GAUZE/BANDAGES/DRESSINGS) IMPLANT
GAUZE XEROFORM 5X9 LF (GAUZE/BANDAGES/DRESSINGS) IMPLANT
GLOVE BIO SURGEON STRL SZ 6.5 (GLOVE) ×2 IMPLANT
GOWN STRL REUS W/ TWL LRG LVL3 (GOWN DISPOSABLE) ×2 IMPLANT
GOWN STRL REUS W/TWL LRG LVL3 (GOWN DISPOSABLE) ×2
KIT BASIN OR (CUSTOM PROCEDURE TRAY) ×1 IMPLANT
MARKER SKIN SURG 5.25 VIO NS (MISCELLANEOUS) IMPLANT
NDL HYPO 25X1 1.5 SAFETY (NEEDLE) ×1 IMPLANT
NEEDLE HYPO 25X1 1.5 SAFETY (NEEDLE) ×1 IMPLANT
NS IRRIG 1000ML POUR BTL (IV SOLUTION) ×1 IMPLANT
PACK GENERAL/GYN (CUSTOM PROCEDURE TRAY) ×1 IMPLANT
PENCIL SMOKE EVACUATOR (MISCELLANEOUS) ×1 IMPLANT
SHEET MEDIUM DRAPE 40X70 STRL (DRAPES) IMPLANT
SPIKE FLUID TRANSFER (MISCELLANEOUS) IMPLANT
SPONGE T-LAP 18X18 ~~LOC~~+RFID (SPONGE) ×1 IMPLANT
STAPLER VISISTAT 35W (STAPLE) IMPLANT
SUT MNCRL AB 3-0 PS2 18 (SUTURE) IMPLANT
SUT MNCRL AB 4-0 PS2 18 (SUTURE) IMPLANT
SUT MON AB 3-0 SH 27 (SUTURE) ×1
SUT MON AB 3-0 SH27 (SUTURE) IMPLANT
SUT MON AB 5-0 PS2 18 (SUTURE) IMPLANT
SUT PROLENE 3 0 SH1 36 (SUTURE) IMPLANT
SUT SILK 3 0 PS 1 (SUTURE) IMPLANT
SWAB COLLECTION DEVICE MRSA (MISCELLANEOUS) IMPLANT
SWAB CULTURE ESWAB REG 1ML (MISCELLANEOUS) IMPLANT
SYR BULB IRRIG 60ML STRL (SYRINGE) IMPLANT
SYR CONTROL 10ML LL (SYRINGE) ×1 IMPLANT
TOWEL GREEN STERILE FF (TOWEL DISPOSABLE) ×2 IMPLANT
TUBE CONNECTING 20X1/4 (TUBING) ×1 IMPLANT
UNDERPAD 30X36 HEAVY ABSORB (UNDERPADS AND DIAPERS) ×1 IMPLANT
YANKAUER SUCT BULB TIP NO VENT (SUCTIONS) ×1 IMPLANT

## 2023-04-02 NOTE — Progress Notes (Signed)
Physical Therapy Session Note  Patient Details  Name: [name] MRN: [id number] Date of Birth: November 26, 1995  Today's Date: 04/02/2023 PT Individual Time: 8295-6213 PT Individual Time Calculation (min): 15 min  Today's Date: 04/02/2023 PT Missed Time: 30 Minutes Missed Time Reason: Other (Comment) (polite refusal)  Short Term Goals: Week 1:  PT Short Term Goal 1 (Week 1): = LTGS due to ELOS. Primary focus is family education for transfers and bed mobility  Skilled Therapeutic Interventions/Progress Updates:  Received pt semi-reclined in bed with sister present at bedside. Pt verbalized plan to go to surgery this afternoon and was told he did not have therapy today - confirmed with MD who stated pt cleared to participate in therapy to tolerance. Pt/sister verbalized (and have demonstrated) confidence with [name] transfers and politely declined need to practice further. Went through sensation, MMT, and pain interference questionnaire and pt/sister with no further questions and verbalized feeling prepared for D/C tomorrow. Concluded session with pt semi-reclined in bed, needs within reach, and sister present at bedside. 30 minutes missed of skilled physical therapy due to polite refusal.   Therapy Documentation Precautions:  Precautions Precautions: Fall, Other (comment) Precaution Comments: suprapubic catheter; external fixators at pelvis; perineal wound Required Braces or Orthoses: Splint/Cast Splint/Cast: RUE and LLE Restrictions Weight Bearing Restrictions: Yes RUE Weight Bearing: Non weight bearing RLE Weight Bearing: Non weight bearing LLE Weight Bearing: Non weight bearing Other Position/Activity Restrictions: ex-fix on pelvis  Therapy/Group: Individual Therapy [name] PT, DPT 04/02/2023, 7:25 AM

## 2023-04-02 NOTE — Interval H&P Note (Signed)
History and Physical Interval Note: Pt met in the pre op area, no change in exam or indication for surgery. Site marked with his concurrence Will proceed with EUA, possible closure at his request  04/02/2023 4:07 PM  [name]  has presented today for surgery, with the diagnosis of perineal region wound.  The various methods of treatment have been discussed with the patient and family. After consideration of risks, benefits and other options for treatment, the patient has consented to  Procedure(s): EXAM UNDER ANESTHESIA, IRRIGATION AND DEBRIDEMENT,POSSIBLE MYRIAD, POSSIBLE WOUND VAC , PERINEAL REGION WOUND (N/A) as a surgical intervention.  The patient's history has been reviewed, patient examined, no change in status, stable for surgery.  I have reviewed the patient's chart and labs.  Questions were answered to the patient's satisfaction.     Santiago Glad

## 2023-04-02 NOTE — Progress Notes (Signed)
Physical Therapy Discharge Summary  Patient Details  Name: [name] MRN: [id number] Date of Birth: 10/04/1995  Date of Discharge from PT service:April 02, 2023  Patient has met 3 of 4 long term goals due to improved activity tolerance, decreased pain, and ability to compensate for deficits. Patient to discharge at a wheelchair level Total Assist.  Patient's care partner is independent to provide the necessary physical assistance at discharge. Pt's sister, [name], participated in numerous hands on family education training sessions and verbalized and demonstrated confidence with [name] transfers and ability to safety care for pt upon discharge.   Reasons goals not met: pt did not meet sitting balance goal of min A as pt unable to tolerance unsupported sitting due to pain and limited ROM due to external fixator on pelvis.   Recommendation:  Patient will benefit from ongoing skilled PT services in outpatient setting after 8 week mark to continue to advance safe functional mobility, address ongoing impairments in transfers, generalized strengthening, endurance, and ROM, dynamic standing balance, gait training, and to minimize fall risk.  Equipment: [name] lift, 20x18 recline back WC with bilateral elevating legrests - purchasing hospital bed privately   Reasons for discharge: treatment goals met and discharge from hospital  Patient/family agrees with progress made and goals achieved: Yes  PT Discharge Precautions/Restrictions Precautions Precautions: Fall;Other (comment) Precaution Comments: suprapubic catheter; external fixators at pelvis; perineal wound Required Braces or Orthoses: Splint/Cast Splint/Cast: RUE and LLE Restrictions Weight Bearing Restrictions: Yes RUE Weight Bearing: Non weight bearing RLE Weight Bearing: Non weight bearing LLE Weight Bearing: Non weight bearing Other Position/Activity Restrictions: ex-fix on pelvis Pain Interference Pain Interference Pain Effect  on Sleep: 1. Rarely or not at all Pain Interference with Therapy Activities: 0. Does not apply - I have not received rehabilitationtherapy in the past 5 days Pain Interference with Day-to-Day Activities: 1. Rarely or not at all Cognition Overall Cognitive Status: Within Functional Limits for tasks assessed Arousal/Alertness: Awake/alert Orientation Level: Oriented X4 Memory: Appears intact Awareness: Appears intact Problem Solving: Appears intact Safety/Judgment: Appears intact Sensation Sensation Light Touch: Appears Intact Hot/Cold: Not tested Proprioception: Appears Intact Stereognosis: Not tested Additional Comments: assessed available areas on BLE around bandaging; pt does not report numbness/tingling/burning Coordination Gross Motor Movements are Fluid and Coordinated: No Fine Motor Movements are Fluid and Coordinated: No Coordination and Movement Description: NWB on 3/4 extremeties & pelvic external fixator limiting gross coordination Finger Nose Finger Test: WFL on LUE only, RUE in cast Heel Shin Test: unable to perform bilaterally Motor  Motor Motor: Abnormal postural alignment and control;Other (comment) Motor - Skilled Clinical Observations: limited due to WB restrictions and pelvic external fixator  Mobility Bed Mobility Bed Mobility: Rolling Right;Rolling Left Rolling Right: Moderate Assistance - Patient 50-74% Rolling Left: Moderate Assistance - Patient 50-74% Supine to Sit: 2 Helpers Sit to Supine: 2 Helpers Transfers Transfers: Transfer via Financial trader (+2) Transfer via Lift Equipment:  Secondary school teacher) Visual merchandiser Ambulation: No Gait Gait: No Stairs / Additional Locomotion Stairs: No Corporate treasurer: No Wheelchair Assistance: Dependent - Patient 0% (BLE NWB and RUE NWB)  Trunk/Postural Assessment  Cervical Assessment Cervical Assessment: Within Functional Limits Thoracic Assessment Thoracic Assessment: Exceptions to [facility]  (limited due to precautions) Lumbar Assessment Lumbar Assessment: Exceptions to [facility] (limited due to precautions) Postural Control Postural Control: Deficits on evaluation Protective Responses: decreased due to WB restrictions and pelvic external fixator in place Postural Limitations: limited due to pelvic external fixator  Balance Balance Balance Assessed: No  Static Sitting Balance Static Sitting - Level of Assistance: 2: Max assist;1: +1 Total assist Dynamic Sitting Balance Dynamic Sitting - Level of Assistance: 1: +1 Total assist Extremity Assessment  RLE Assessment RLE Assessment: Exceptions to [facility] General Strength Comments: grossly able to initiate knee flexion to ~40 degrees and extension/quad set; ankle 3/5; leg remains externally rotated LLE Assessment LLE Assessment: Exceptions to [facility] General Strength Comments: bandaged and cast on LLE from knee to ankle - pt able to activate hip flexor to attempt to lift LE but cast preventing full range   [name] PT, DPT 04/02/2023, 7:22 AM

## 2023-04-02 NOTE — Progress Notes (Signed)
CHG bath performed last night and this morning.

## 2023-04-02 NOTE — Anesthesia Procedure Notes (Signed)
Procedure Name: LMA Insertion Date/Time: 04/02/2023 4:22 PM  Performed by: Drema Pry, CRNAPre-anesthesia Checklist: Patient identified, Emergency Drugs available, Suction available and Patient being monitored Patient Re-evaluated:Patient Re-evaluated prior to induction Oxygen Delivery Method: Circle System Utilized Preoxygenation: Pre-oxygenation with 100% oxygen Induction Type: IV induction Ventilation: Mask ventilation without difficulty LMA: LMA inserted LMA Size: 4.0 Number of attempts: 1 Placement Confirmation: positive ETCO2 Tube secured with: Tape Dental Injury: Teeth and Oropharynx as per pre-operative assessment
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
## 2023-04-02 NOTE — Progress Notes (Addendum)
Patient ID: [name], male   DOB: 11/13/1995, 27 y.o.   MRN: [id number]  Team Conference Report to Patient/Family  Team Conference discussion was reviewed with the patient and caregiver, including goals, any changes in plan of care and target discharge date.  Patient and caregiver express understanding and are in agreement.  The patient has a target discharge date of 04/03/23.  Sw met with patient and sister at bedside. Sw provided team conference updates. SW to provide [facility] and transportation resources. Sister expressed [employer] made attempt to deliver the incorrect WC and has not returned. Sw will follow up with [employer]. SW will arrange PTAR for d/c tomorrow. No additional questions or concerns.  SW informed WC currently OOS with [employer], Team informed. SW waiting on estimated time arrival.   [facility] and transportation resources provided and discussed.   Andria Rhein 04/02/2023, 1:00 PM

## 2023-04-02 NOTE — Transfer of Care (Signed)
Immediate Anesthesia Transfer of Care Note  Patient: [name]  Procedure(s) Performed: EXAM UNDER ANESTHESIA, IRRIGATION AND DEBRIDEMENT, PARTIAL CLOSURE OF  PERINEAL REGION WOUND  Patient Location: PACU  Anesthesia Type:General  Level of Consciousness: drowsy, patient cooperative, and responds to stimulation  Airway & Oxygen Therapy: Patient Spontanous Breathing  Post-op Assessment: Report given to RN and Post -op Vital signs reviewed and stable  Post vital signs: Reviewed and stable  Last Vitals:  Vitals Value Taken Time  BP 122/79 04/02/23 1741  Temp    Pulse 100 04/02/23 1743  Resp 13 04/02/23 1743  SpO2 93 % 04/02/23 1743  Vitals shown include unvalidated device data.  Last Pain:  Vitals:   04/02/23 1523  TempSrc: Oral  PainSc: 0-No pain      Patients Stated Pain Goal: 0 (04/01/23 2243)  Complications: No notable events documented.

## 2023-04-02 NOTE — Progress Notes (Signed)
Physical Therapy Session Note  Patient Details  Name: [name] MRN: [id number] Date of Birth: 1996-09-09  Today's Date: 04/02/2023 PT Individual Time: 0931-0946 PT Individual Time Calculation (min): 15 min   Short Term Goals: Week 1:  PT Short Term Goal 1 (Week 1): = LTGS due to ELOS. Primary focus is family education for transfers and bed mobility  Pt missed of skilled therapy due to both pt and sister, [name], feeling confident in transfer family ed as pt is d/c 7/4. Sister, [name], reported only feeling needing more training on wound care. RN present at that time providing medication.        Therapy Documentation Precautions:  Precautions Precautions: Fall, Other (comment) Precaution Comments: suprapubic catheter; external fixators at pelvis; perineal wound Required Braces or Orthoses: Splint/Cast Splint/Cast: RUE and LLE Restrictions Weight Bearing Restrictions: Yes RUE Weight Bearing: Non weight bearing RLE Weight Bearing: Non weight bearing LLE Weight Bearing: Non weight bearing Other Position/Activity Restrictions: ex-fix on pelvis General: PT Amount of Missed Time (min): 30 Minutes PT Missed Treatment Reason: Other (Comment) (polite refusal)   Therapy/Group: Individual Therapy  Jazyah Butsch PTA 04/02/2023, 9:57 AM
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
## 2023-04-02 NOTE — Progress Notes (Signed)
Occupational Therapy Session Note  Patient Details  Name: [name] MRN: [id number] Date of Birth: 1995/11/19  Today's Date: 04/02/2023 OT Individual Time: 4098-1191 OT Individual Time Calculation (min): 70 min    Short Term Goals: Week 1:  OT Short Term Goal 1 (Week 1): STG = LTG due to ELOS  Skilled Therapeutic Interventions/Progress Updates:  Skilled OT intervention completed with focus on DC planning, self care and positioning education. Pt received semi supine in bed, agreeable to session. No pain reported.  Sister and pt had several questions with OT educating on the following during session: -Medical transport which involves EMS picking pt up, placing him on stretcher and getting him set up on the bed -OP appts would involve hoyer transfer into w/c, then dependent transport in w/c via ramp to the transport van that has ramp on back end of it -Sister was concerned the ramp her dad built may not be wide enough therefore with father on the phone, OT verified that width of ramp was 38ft and recline back w/c from wheel <> wheel was less than 77ft -Suggested placement of bed pan with caregiver on Lt side of bed while pt is in Rt side lying to prevent shearing and promote protection of the wound due to the deepness/location of wound at groin/perineal area -Demonstrated placement of bed pan as described above with downward force to bed vs into pt for further wound protection -Sister was able to adequately place/remove bed pan with pt rolling > Rt side with mod A for LLE management only, with cues needed for orientation of bed pan, how to prevent spillage -Discussed use of saline as a sterile fluid for pericare vs water from shower even if in bathroom on recline back shower chair -Assisted pt/sister on looking up mesh underwear/depends, saline, flush bottles and chuck pads that can be purchased from various places online for toileting needs as completed in hospital  NT in room to empty  catheter. Sister assisted pt with gown change, removal of shorts via cutting in prep for later procedure, with OT only providing assist for mod A leg management during rolling. Pt remained semi reclined in bed, with bed alarm on/activated, and with all needs in reach at end of session.   Therapy Documentation Precautions:  Precautions Precautions: Fall, Other (comment) Precaution Comments: suprapubic catheter; external fixators at pelvis; perineal wound Required Braces or Orthoses: Splint/Cast Splint/Cast: RUE and LLE Restrictions Weight Bearing Restrictions: Yes RUE Weight Bearing: Non weight bearing RLE Weight Bearing: Non weight bearing LLE Weight Bearing: Non weight bearing Other Position/Activity Restrictions: ex-fix on pelvis    Therapy/Group: Individual Therapy  Melvyn Novas, MS, OTR/L  04/02/2023, 3:42 PM

## 2023-04-02 NOTE — Progress Notes (Signed)
Occupational Therapy Discharge Summary  Patient Details  Name: [name] MRN: [id number] Date of Birth: 03-23-1996  Date of Discharge from OT service:April 02, 2023  Patient has met 5 of 6 long term goals due to improved activity tolerance, ability to compensate for deficits, and improved coordination.  Patient to discharge at overall  Max to total A  level. Patient's care partner is independent to provide the necessary physical assistance at discharge.    Reasons goals not met: sitting balance goal not met at min A level due to inability to tolerate/achieve sitting upright with pelvic external fixator in place and due to core strength deficits  Recommendation:  Patient will benefit from ongoing skilled OT services in outpatient setting at the 8 week mark once WB precautions are lifted to continue to advance functional skills in the area of BADL and Reduce care partner burden.  Equipment: No equipment provided  Reasons for discharge: treatment goals met and discharge from hospital  Patient/family agrees with progress made and goals achieved: Yes  OT Discharge Precautions/Restrictions  Precautions Precautions: Fall;Other (comment) Precaution Comments: suprapubic catheter; external fixators at pelvis; perineal wound Required Braces or Orthoses: Splint/Cast Splint/Cast: RUE and LLE Restrictions Weight Bearing Restrictions: Yes RUE Weight Bearing: Non weight bearing RLE Weight Bearing: Non weight bearing LLE Weight Bearing: Non weight bearing Other Position/Activity Restrictions: ex-fix on pelvis ADL ADL Eating: Set up Where Assessed-Eating: Bed level Grooming: Setup Where Assessed-Grooming: Bed level Upper Body Bathing: Minimal assistance Where Assessed-Upper Body Bathing: Bed level Lower Body Bathing: Dependent Where Assessed-Lower Body Bathing: Bed level Upper Body Dressing: Supervision/safety Where Assessed-Upper Body Dressing: Bed level Lower Body Dressing: Maximal  assistance Where Assessed-Lower Body Dressing: Bed level Toileting: Dependent (+2) Where Assessed-Toileting: Other (Comment) (simulated on recline back shower chair) Toilet Transfer: Dependent (+2) Toilet Transfer Method: Other (comment) (hoyer) Acupuncturist: Other (comment) (recline back shower chair) Tub/Shower Transfer: Unable to assess Tub/Shower Transfer Method: Unable to assess Film/video editor: Unable to assess Film/video editor Method: Unable to assess Vision Baseline Vision/History: 0 No visual deficits Patient Visual Report: No change from baseline Vision Assessment?: No apparent visual deficits Perception  Perception: Within Functional Limits Praxis Praxis: Intact Cognition Cognition Overall Cognitive Status: Within Functional Limits for tasks assessed Arousal/Alertness: Awake/alert Orientation Level: Person;Place;Situation Person: Oriented Place: Oriented Situation: Oriented Memory: Appears intact Awareness: Appears intact Problem Solving: Appears intact Safety/Judgment: Appears intact Brief Interview for Mental Status (BIMS) Repetition of Three Words (First Attempt): 3 Temporal Orientation: Year: Correct Temporal Orientation: Month: Accurate within 5 days Temporal Orientation: Day: Correct Recall: "Sock": Yes, no cue required Recall: "Blue": Yes, no cue required Recall: "Bed": Yes, no cue required BIMS Summary Score: 15 Sensation Sensation Light Touch: Appears Intact Hot/Cold: Not tested Proprioception: Appears Intact Stereognosis: Not tested Additional Comments: assessed available areas on BLE around bandaging; pt does not report numbness/tingling/burning Coordination Gross Motor Movements are Fluid and Coordinated: No Fine Motor Movements are Fluid and Coordinated: No Coordination and Movement Description: NWB on 3/4 extremeties & pelvic external fixator limiting gross coordination Finger Nose Finger Test: WFL on LUE only,  RUE in cast Heel Shin Test: unable to perform bilaterally Motor  Motor Motor: Abnormal postural alignment and control;Other (comment) Motor - Skilled Clinical Observations: limited due to WB restrictions and pelvic external fixator Mobility  Bed Mobility Bed Mobility: Rolling Right;Rolling Left Rolling Right: Moderate Assistance - Patient 50-74% Rolling Left: Moderate Assistance - Patient 50-74% Supine to Sit: 2 Helpers Sit to Supine: 2 Helpers  Trunk/Postural  Assessment  Cervical Assessment Cervical Assessment: Within Functional Limits Thoracic Assessment Thoracic Assessment: Exceptions to [facility] (limited due to precautions) Lumbar Assessment Lumbar Assessment: Exceptions to [facility] (limited due to precautions) Postural Control Postural Control: Deficits on evaluation Protective Responses: decreased due to WB restrictions and pelvic external fixator in place Postural Limitations: limited due to pelvic external fixator  Balance Balance Balance Assessed: No Static Sitting Balance Static Sitting - Level of Assistance: 2: Max assist;1: +1 Total assist Dynamic Sitting Balance Dynamic Sitting - Level of Assistance: 1: +1 Total assist Extremity/Trunk Assessment RUE Assessment RUE Assessment: Exceptions to [facility] Active Range of Motion (AROM) Comments: WFL at shoulder & hand; restricted at elbow/wrist due to splint in place General Strength Comments: Unable to formally assess due to NWB precaution LUE Assessment LUE Assessment: Exceptions to [facility] Active Range of Motion (AROM) Comments: [facility] General Strength Comments: 4-/5 shoulder/elbow, 5/5 gross grasp/wrist   [name] Carl Dillyn Menna, MS, OTR/L  04/02/2023, 3:43 PM

## 2023-04-02 NOTE — Plan of Care (Signed)
  Problem: RH Balance Goal: LTG: Patient will maintain dynamic sitting balance (OT) Description: LTG:  Patient will maintain dynamic sitting balance with assistance during activities of daily living (OT) Outcome: Not Met (add Reason) Flowsheets (Taken 04/02/2023 1543) LTG: Pt will maintain dynamic sitting balance during ADLs with: (Not met due to inability to tolerate sitting upright with external fixator in place) --   Problem: RH Eating Goal: LTG Patient will perform eating w/assist, cues/equip (OT) Description: LTG: Patient will perform eating with assist, with/without cues using equipment (OT) Outcome: Completed/Met   Problem: RH Grooming Goal: LTG Patient will perform grooming w/assist,cues/equip (OT) Description: LTG: Patient will perform grooming with assist, with/without cues using equipment (OT) Outcome: Completed/Met   Problem: RH Bathing Goal: LTG Patient will bathe all body parts with assist levels (OT) Description: LTG: Patient will bathe all body parts with assist levels (OT) Outcome: Completed/Met   Problem: RH Dressing Goal: LTG Patient will perform upper body dressing (OT) Description: LTG Patient will perform upper body dressing with assist, with/without cues (OT). Outcome: Completed/Met   Problem: RH Pre-functional/Other (Specify) Goal: RH LTG OT (Specify) 1 Description: RH LTG OT (Specify) 1 Outcome: Completed/Met

## 2023-04-02 NOTE — Progress Notes (Signed)
Physical Therapy Session Note  Patient Details  Name: [name] MRN: [id number] Date of Birth: 1996-07-01  Today's Date: 04/02/2023 PT Missed Time: 45 Minutes Missed Time Reason: Unavailable (Comment) (pending transport to surgical procedure)  Short Term Goals: Week 1:  PT Short Term Goal 1 (Week 1): = LTGS due to ELOS. Primary focus is family education for transfers and bed mobility  Skilled Therapeutic Interventions/Progress Updates:      Therapy Documentation Precautions:  Precautions Precautions: Fall, Other (comment) Precaution Comments: suprapubic catheter; external fixators at pelvis; perineal wound Required Braces or Orthoses: Splint/Cast Splint/Cast: RUE and LLE Restrictions Weight Bearing Restrictions: Yes RUE Weight Bearing: Non weight bearing RLE Weight Bearing: Non weight bearing LLE Weight Bearing: Non weight bearing Other Position/Activity Restrictions: ex-fix on pelvis   Pt missed 45 minutes of skilled PT as pt pending transportation for surgical procedure, plan to make up missed minutes as able.    Therapy/Group: Individual Therapy  Truitt Leep Truitt Leep PT, DPT  04/02/2023, 6:04 AM

## 2023-04-03 ENCOUNTER — Encounter (HOSPITAL_COMMUNITY): Payer: Self-pay | Admitting: Plastic Surgery

## 2023-04-03 NOTE — Progress Notes (Signed)
Patient ID: [name], male   DOB: 08-11-1996, 27 y.o.   MRN: [id number]  Sw spoke with patient and family and room and discussed arranging transportation for 11 AM. Sw arranged with [facility]. Staffed informed. Discharge packet left at nursing station. Card information provided to family.

## 2023-04-03 NOTE — Progress Notes (Signed)
Had a quite and stable night. In good spirits. Denies pain this morning. Managed to change portion of dressing on scrotum and suprapubic, but requested the extensive dressing change including external fixators be done later in the morning. Suprapubic Foley catheter in place and draining well. Foley Care done earlier. Day shift RN to be notified. Safety maintained at all times.

## 2023-04-03 NOTE — Progress Notes (Signed)
Patient ID: [name], male   DOB: [birth year], 27 y.o.   MRN: [id number]  Sw met with patient and father in room to discuss delivery of wheelchair arranged for tomorrow per [employer]. Patient and father prefer to still d/c today and patient will remain in the bed at home today. SW will arrange [facility] for 11 AM. Patient waiting on discharge instructions and medications.   Patient MATCH entered:  ID: [id number] Effective: 04/03/2023 Term: 04/10/2023

## 2023-04-03 NOTE — Progress Notes (Signed)
POD #1 from perineal wound debridement, partial closure.  Doing well, no specific complaints. Pain well controlled.  Dressings not removed this morning.  Discussed wound care with [name] and his father. He will continue with adaptic and Vashe moistened dressings twice a day and as needed if soiled. He and his sister are comfortable with this.  Will plan to return to the OR in 1-2 weeks, this will be scheduled through my office.  He should call the office ( [facility], [address] [place], [address], [phone] ) tomorrow to schedule a follow up appointment.
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
## 2023-04-03 NOTE — Progress Notes (Signed)
Teaching completed with pts sister regarding dressing changes. Both sister and pt voice understanding. Dressing supplies packed and send home with patient along with all personal belongings.

## 2023-04-03 NOTE — Progress Notes (Signed)
Inpatient Rehabilitation Care Coordinator Discharge Note   Patient Details  Name: [name] MRN: [id number] Date of Birth: 1996-05-06   Discharge location: Home  Length of Stay: 6 Days  Discharge activity level: Max/Total  Home/community participation: Sister, Mother, Father. etc  Patient response WU:[id number] Literacy - How often do you need to have someone help you when you read instructions, pamphlets, or other written material from your doctor or pharmacy?: Often  Patient response YN:WGNFAO Isolation - How often do you feel lonely or isolated from those around you?: Never  Services provided included: MD, RD, PT, OT, SLP, RN, CM, TR, Pharmacy, SW  Financial Services:  Financial Services Utilized: Other (Comment) (uninsured)    Choices offered to/list presented to: Patient  Follow-up services arranged:  DME      DME : [name] [employer]    Patient response to transportation need: Is the patient able to respond to transportation needs?: Yes In the past 12 months, has lack of transportation kept you from medical appointments or from getting medications?: No In the past 12 months, has lack of transportation kept you from meetings, work, or from getting things needed for daily living?: No   Patient/Family verbalized understanding of follow-up arrangements:  Yes  Individual responsible for coordination of the follow-up plan: Patient, Byrd Hesselbach (sister) [phone] and father  Confirmed correct DME delivered: Andria Rhein 04/03/2023    Comments (or additional information):  Summary of Stay    Date/Time Discharge Planning CSW  03/31/23 1320 Patient uninsured. Patient and family requesting to d/c home once family education is completed. Pt and family concered about self pay bill and were under the impression to admit to [facility] for family EDU.  Father hiring [facility] to assist with care needs. Sister able to assist and provide supervision 24/7. CJB        Andria Rhein

## 2023-04-03 NOTE — Progress Notes (Signed)
Inpatient Rehabilitation Discharge Medication Review by a Pharmacist   A complete drug regimen review was completed for this patient to identify any potential clinically significant medication issues.   High Risk Drug Classes Is patient taking? Indication by Medication  Antipsychotic No    Anticoagulant Yes Apixaban - VTE ppx for 30 days  Antibiotic No   Opioid Yes Oxycodone - pain  Antiplatelet No    Hypoglycemics/insulin No    Vasoactive Medication No    Chemotherapy No    Other Yes Baclofen - muscle spams Docusate, Miralax - constipation Gabapentin - neuropathy        Type of Medication Issue Identified Description of Issue Recommendation(s)  Drug Interaction(s) (clinically significant)        Duplicate Therapy        Allergy        No Medication Administration End Date        Incorrect Dose        Additional Drug Therapy Needed        Significant med changes from prior encounter (inform family/care partners about these prior to discharge). All medications will be new at discharge Communicate medication changes with patient/family at discharge  Other            Clinically significant medication issues were identified that warrant physician communication and completion of prescribed/recommended actions by midnight of the next day:  No   Pharmacist comments:     Time spent performing this drug regimen review (minutes): 20  Thank you for involving pharmacy in this patient's care.  Loura Back, PharmD, BCPS

## 2023-04-03 NOTE — Anesthesia Postprocedure Evaluation (Addendum)
Anesthesia Post Note  Patient: [name]  Procedure(s) Performed: EXAM UNDER ANESTHESIA, IRRIGATION AND DEBRIDEMENT, PARTIAL CLOSURE OF  PERINEAL REGION WOUND     Patient location during evaluation: PACU Anesthesia Type: General Level of consciousness: awake and alert Pain management: pain level controlled Vital Signs Assessment: post-procedure vital signs reviewed and stable Respiratory status: spontaneous breathing, nonlabored ventilation and respiratory function stable Cardiovascular status: blood pressure returned to baseline Postop Assessment: no apparent nausea or vomiting Anesthetic complications: no   No notable events documented.          Shanda Howells

## 2023-04-10 ENCOUNTER — Telehealth: Payer: Self-pay | Admitting: Plastic Surgery

## 2023-04-10 NOTE — Telephone Encounter (Signed)
Spoke with [name], discussed sending photo via MyChart, but based on her description on the phone, it sounds like the adaptic is still in place. We recommend removing that if she can, if she cannot, she can send a photo and we will evaluate via MyChart.  She was agreeable to this.

## 2023-04-10 NOTE — Telephone Encounter (Signed)
Patient's sister, Detrich Rakestraw, called.  Pt had Sx 04/02/23. Pt was to have both layers in the wound removed that evening during the dressing change done by the [facility]. Patient's sister is not sure everything was taken out because she can see something that looks like gauze in the wound.  She is not sure if she should remove anything or wait until the appt on 04/17/23 because wound is closing. Please call [name] at [phone]

## 2023-04-15 ENCOUNTER — Emergency Department (HOSPITAL_COMMUNITY)
Admission: EM | Admit: 2023-04-15 | Discharge: 2023-04-15 | Disposition: A | Discharge status: Home / Self Care | Payer: Self-pay | Attending: Emergency Medicine | Admitting: Emergency Medicine

## 2023-04-15 ENCOUNTER — Encounter (HOSPITAL_COMMUNITY): Payer: Self-pay | Admitting: Emergency Medicine

## 2023-04-15 ENCOUNTER — Other Ambulatory Visit: Payer: Self-pay

## 2023-04-15 DIAGNOSIS — Z7901 Long term (current) use of anticoagulants: Secondary | ICD-10-CM | POA: Insufficient documentation

## 2023-04-15 DIAGNOSIS — Z9359 Other cystostomy status: Secondary | ICD-10-CM

## 2023-04-15 DIAGNOSIS — T83098A Other mechanical complication of other indwelling urethral catheter, initial encounter: Secondary | ICD-10-CM | POA: Insufficient documentation

## 2023-04-15 DIAGNOSIS — Y732 Prosthetic and other implants, materials and accessory gastroenterology and urology devices associated with adverse incidents: Secondary | ICD-10-CM | POA: Insufficient documentation

## 2023-04-15 MED ORDER — ACETAMINOPHEN 500 MG PO TABS
1000.0000 mg | ORAL_TABLET | ORAL | Status: AC
  Administered 2023-04-15: 1000 mg via ORAL
  Filled 2023-04-15: qty 2
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
## 2023-04-15 NOTE — Discharge Instructions (Signed)
You were seen for your suprapubic catheter being clogged in the emergency department.   At home, please continue your catheter care.    Check your MyChart online for the results of any tests that had not resulted by the time you left the emergency department.   Follow-up with your urologist as an outpatient.  Return immediately to the emergency department if you experience any of the following: Suprapubic pain, fevers, flank pain, catheter obstruction, or any other concerning symptoms.    Thank you for visiting our Emergency Department. It was a pleasure taking care of you today.

## 2023-04-15 NOTE — ED Notes (Signed)
Got patient temp patient is resting with call bell in reach
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
## 2023-04-15 NOTE — ED Notes (Signed)
Pt to be transported home via [facility].

## 2023-04-16 ENCOUNTER — Encounter (HOSPITAL_COMMUNITY): Payer: Self-pay | Admitting: Surgery

## 2023-04-17 ENCOUNTER — Encounter: Payer: Self-pay | Admitting: Plastic Surgery

## 2023-04-17 ENCOUNTER — Telehealth: Payer: Self-pay | Admitting: Plastic Surgery

## 2023-04-17 ENCOUNTER — Ambulatory Visit (INDEPENDENT_AMBULATORY_CARE_PROVIDER_SITE_OTHER): Payer: Self-pay | Admitting: Plastic Surgery

## 2023-04-17 VITALS — BP 112/68 | HR 84 | Ht 65.0 in

## 2023-04-17 DIAGNOSIS — Z9889 Other specified postprocedural states: Secondary | ICD-10-CM

## 2023-04-17 DIAGNOSIS — S31109S Unspecified open wound of abdominal wall, unspecified quadrant without penetration into peritoneal cavity, sequela: Secondary | ICD-10-CM
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
## 2023-04-17 NOTE — Telephone Encounter (Signed)
Patient called because he was seen by Dr Ladona Ridgel today and he was supposed to get a refill on Oxycodone HCl 10 MG TABS  and have it sent to [facility] [id number] - [address], [place] - [address]

## 2023-04-23 ENCOUNTER — Ambulatory Visit (INDEPENDENT_AMBULATORY_CARE_PROVIDER_SITE_OTHER): Payer: Self-pay | Admitting: Student

## 2023-04-23 ENCOUNTER — Encounter: Payer: Self-pay | Admitting: Student

## 2023-04-23 VITALS — BP 109/70 | HR 94

## 2023-04-23 DIAGNOSIS — S31109D Unspecified open wound of abdominal wall, unspecified quadrant without penetration into peritoneal cavity, subsequent encounter: Secondary | ICD-10-CM

## 2023-04-23 DIAGNOSIS — S31109S Unspecified open wound of abdominal wall, unspecified quadrant without penetration into peritoneal cavity, sequela: Secondary | ICD-10-CM

## 2023-04-23 MED ORDER — OXYCODONE HCL 5 MG PO TABS: 5.0000 mg | ORAL_TABLET | Freq: Four times a day (QID) | ORAL | 0 refills | Status: DC | PRN

## 2023-04-23 MED ORDER — ONDANSETRON HCL 4 MG PO TABS: 4.0000 mg | ORAL_TABLET | Freq: Three times a day (TID) | ORAL | 0 refills | Status: DC | PRN
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
## 2023-04-24 ENCOUNTER — Other Ambulatory Visit: Payer: Self-pay

## 2023-04-24 ENCOUNTER — Encounter (HOSPITAL_BASED_OUTPATIENT_CLINIC_OR_DEPARTMENT_OTHER): Payer: Self-pay | Admitting: Plastic Surgery

## 2023-04-24 NOTE — Progress Notes (Signed)
Chart reviewed with Dr. Richardson Landry. Surgery scheduled for 04/29/23 at [facility] should be moved to Main OR. Message left for Heather at Dr. Lubertha Basque office.

## 2023-04-25 ENCOUNTER — Other Ambulatory Visit (HOSPITAL_COMMUNITY): Payer: Self-pay
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
## 2023-04-30 ENCOUNTER — Other Ambulatory Visit (HOSPITAL_COMMUNITY): Payer: Self-pay

## 2023-05-01 ENCOUNTER — Other Ambulatory Visit (HOSPITAL_COMMUNITY): Payer: Self-pay

## 2023-05-01 ENCOUNTER — Telehealth: Payer: Self-pay | Admitting: *Deleted

## 2023-05-01 ENCOUNTER — Other Ambulatory Visit: Payer: Self-pay | Admitting: Plastic Surgery

## 2023-05-01 ENCOUNTER — Telehealth: Payer: Self-pay | Admitting: Plastic Surgery

## 2023-05-01 MED ORDER — GABAPENTIN 400 MG PO CAPS: 400.0000 mg | ORAL_CAPSULE | Freq: Three times a day (TID) | ORAL | 1 refills | Status: DC

## 2023-05-01 MED ORDER — BACLOFEN 5 MG PO TABS: 5.0000 mg | ORAL_TABLET | Freq: Three times a day (TID) | ORAL | 1 refills | Status: DC

## 2023-05-01 NOTE — Telephone Encounter (Signed)
Called pt and left voicemail message. Thank you
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
## 2023-05-01 NOTE — Telephone Encounter (Signed)
[name], can you please let them know they need to contact patient's primary care provider or whoever has been prescribing them in the outpatient setting as we do not manage those medications.   Thank you, Alan Ripper

## 2023-05-01 NOTE — Telephone Encounter (Signed)
Mr Mosqueco called to request refill on his baclofen gabapentin and eliquis. I see that the eliquis was only for 30 days. He says that he does not have a PCP currently and he was "no formal follow up needed".  Do you want to grant him a 30 day supply of the baclofen and gabapentin until he can maybe get a PCP in place?

## 2023-05-01 NOTE — Telephone Encounter (Signed)
Per Dr Carlis Abbott I have refilled his baclofen and gabapentin to the [facility] and informed him that no further refills on Eliquis is needed. He will need to establish with a primary care as we will not prescribe any further medication for him. He states that understands.

## 2023-05-01 NOTE — Telephone Encounter (Signed)
[name], sister, called and pt needs a refill on his medications - eliquis, gabopentin, and baclofen.  He only has enough for 1 more day.  Please call her at [phone] or [phone].

## 2023-05-05 ENCOUNTER — Encounter (HOSPITAL_COMMUNITY): Payer: Self-pay | Admitting: Plastic Surgery

## 2023-05-05 ENCOUNTER — Other Ambulatory Visit: Payer: Self-pay

## 2023-05-05 NOTE — Progress Notes (Signed)
PCP -  none Cardiologist - n/a  CT Chest x-ray - 03/25/23 EKG - n/a Stress Test - n/a ECHO - n/a Cardiac Cath - n/a  ICD Pacemaker/Loop - n/a  Sleep Study -  n/a CPAP - none  Diabetes - n/a  ERAS: Clear liquids til 12 Noon DOS.  STOP now taking any Aspirin (unless otherwise instructed by your surgeon), Aleve, Naproxen, Ibuprofen, Motrin, Advil, Goody's, BC's, all herbal medications, fish oil, and all vitamins.   Coronavirus Screening Do you have any of the following symptoms:  Cough yes/no: No Fever (>100.63F)  yes/no: No Runny nose yes/no: No Sore throat yes/no: No Difficulty breathing/shortness of breath  yes/no: No  Have you traveled in the last 14 days and where? yes/no: No  Patient verbalized understanding of instructions that were given via phone

## 2023-05-06 ENCOUNTER — Ambulatory Visit (HOSPITAL_COMMUNITY)
Admission: RE | Admit: 2023-05-06 | Discharge: 2023-05-06 | Disposition: A | Discharge status: Home / Self Care | Payer: Self-pay | Attending: Plastic Surgery | Admitting: Plastic Surgery

## 2023-05-06 ENCOUNTER — Ambulatory Visit (HOSPITAL_COMMUNITY): Payer: Self-pay | Admitting: Anesthesiology

## 2023-05-06 ENCOUNTER — Encounter (HOSPITAL_COMMUNITY)
Admission: RE | Disposition: A | Discharge status: Home / Self Care | Payer: Self-pay | Source: Home / Self Care | Attending: Plastic Surgery

## 2023-05-06 ENCOUNTER — Other Ambulatory Visit: Payer: Self-pay

## 2023-05-06 ENCOUNTER — Encounter (HOSPITAL_COMMUNITY): Payer: Self-pay | Admitting: Plastic Surgery

## 2023-05-06 ENCOUNTER — Ambulatory Visit (HOSPITAL_BASED_OUTPATIENT_CLINIC_OR_DEPARTMENT_OTHER): Payer: Self-pay | Admitting: Anesthesiology

## 2023-05-06 DIAGNOSIS — Z993 Dependence on wheelchair: Secondary | ICD-10-CM | POA: Insufficient documentation

## 2023-05-06 DIAGNOSIS — S3130XA Unspecified open wound of scrotum and testes, initial encounter: Secondary | ICD-10-CM

## 2023-05-06 DIAGNOSIS — Z7901 Long term (current) use of anticoagulants: Secondary | ICD-10-CM | POA: Insufficient documentation

## 2023-05-06 DIAGNOSIS — S31501D Unspecified open wound of unspecified external genital organs, male, subsequent encounter: Secondary | ICD-10-CM

## 2023-05-06 DIAGNOSIS — S31109S Unspecified open wound of abdominal wall, unspecified quadrant without penetration into peritoneal cavity, sequela: Secondary | ICD-10-CM | POA: Insufficient documentation

## 2023-05-06 HISTORY — PX: DEBRIDEMENT AND CLOSURE WOUND: SHX5614

## 2023-05-06 LAB — BASIC METABOLIC PANEL
Anion gap: 18 — ABNORMAL HIGH (ref 5–15)
BUN: 11 mg/dL (ref 6–20)
CO2: 26 mmol/L (ref 22–32)
Calcium: 10.4 mg/dL — ABNORMAL HIGH (ref 8.9–10.3)
Chloride: 95 mmol/L — ABNORMAL LOW (ref 98–111)
Creatinine, Ser: 0.55 mg/dL — ABNORMAL LOW (ref 0.61–1.24)
GFR, Estimated: >60 mL/min (ref >60)
Glucose, Bld: 89 mg/dL (ref 70–99)
Potassium: 3.8 mmol/L (ref 3.5–5.1)
Sodium: 139 mmol/L (ref 135–145)

## 2023-05-06 LAB — CBC
HCT: 42.2 % (ref 39.0–52.0)
Hemoglobin: 13.5 g/dL (ref 13.0–17.0)
MCH: 26.9 pg (ref 26.0–34.0)
MCHC: 32 g/dL (ref 30.0–36.0)
MCV: 84.1 fL (ref 80.0–100.0)
Platelets: 441 10*3/uL — ABNORMAL HIGH (ref 150–400)
RBC: 5.02 MIL/uL (ref 4.22–5.81)
RDW: 14.3 % (ref 11.5–15.5)
WBC: 8.5 10*3/uL (ref 4.0–10.5)
nRBC: 0 % (ref 0.0–0.2)

## 2023-05-06 SURGERY — DEBRIDEMENT, WOUND, WITH CLOSURE
Anesthesia: General | Site: Groin | Laterality: Left

## 2023-05-06 MED ORDER — BACITRACIN ZINC 500 UNIT/GM EX OINT
TOPICAL_OINTMENT | CUTANEOUS | Status: AC
  Filled 2023-05-06: qty 28.35

## 2023-05-06 MED ORDER — MIDAZOLAM HCL 2 MG/2ML IJ SOLN
INTRAMUSCULAR | Status: AC
  Filled 2023-05-06: qty 2

## 2023-05-06 MED ORDER — ONDANSETRON HCL 4 MG/2ML IJ SOLN
INTRAMUSCULAR | Status: DC | PRN
  Administered 2023-05-06: 4 mg via INTRAVENOUS

## 2023-05-06 MED ORDER — KETAMINE HCL 50 MG/5ML IJ SOSY
PREFILLED_SYRINGE | INTRAMUSCULAR | Status: AC
  Filled 2023-05-06: qty 5

## 2023-05-06 MED ORDER — BUPIVACAINE-EPINEPHRINE (PF) 0.25% -1:200000 IJ SOLN
INTRAMUSCULAR | Status: AC
  Filled 2023-05-06: qty 30

## 2023-05-06 MED ORDER — LIDOCAINE-EPINEPHRINE 1 %-1:100000 IJ SOLN
INTRAMUSCULAR | Status: AC
  Filled 2023-05-06: qty 1

## 2023-05-06 MED ORDER — HYDROMORPHONE HCL 1 MG/ML IJ SOLN: 0.2500 mg | INTRAMUSCULAR | Status: DC | PRN

## 2023-05-06 MED ORDER — LIDOCAINE 2% (20 MG/ML) 5 ML SYRINGE
INTRAMUSCULAR | Status: DC | PRN
  Administered 2023-05-06: 100 mg via INTRAVENOUS

## 2023-05-06 MED ORDER — BACITRACIN ZINC 500 UNIT/GM EX OINT
TOPICAL_OINTMENT | CUTANEOUS | Status: DC | PRN
  Administered 2023-05-06: 1 via TOPICAL

## 2023-05-06 MED ORDER — HYDROMORPHONE HCL 1 MG/ML IJ SOLN
INTRAMUSCULAR | Status: AC
  Filled 2023-05-06: qty 0.5

## 2023-05-06 MED ORDER — PROPOFOL 10 MG/ML IV BOLUS
INTRAVENOUS | Status: DC | PRN
Start: 2023-05-06 — End: 2023-05-06
  Administered 2023-05-06: 200 mg via INTRAVENOUS

## 2023-05-06 MED ORDER — MIDAZOLAM HCL 2 MG/2ML IJ SOLN
INTRAMUSCULAR | Status: DC | PRN
  Administered 2023-05-06: 2 mg via INTRAVENOUS

## 2023-05-06 MED ORDER — CHLORHEXIDINE GLUCONATE CLOTH 2 % EX PADS: 6.0000 | MEDICATED_PAD | Freq: Once | CUTANEOUS | Status: DC

## 2023-05-06 MED ORDER — LACTATED RINGERS IV SOLN: INTRAVENOUS | Status: DC

## 2023-05-06 MED ORDER — 0.9 % SODIUM CHLORIDE (POUR BTL) OPTIME
TOPICAL | Status: DC | PRN
  Administered 2023-05-06: 1000 mL

## 2023-05-06 MED ORDER — CEFAZOLIN SODIUM-DEXTROSE 2-4 GM/100ML-% IV SOLN: 2.0000 g | INTRAVENOUS | Status: DC

## 2023-05-06 MED ORDER — PROPOFOL 500 MG/50ML IV EMUL
INTRAVENOUS | Status: DC | PRN
  Administered 2023-05-06: 200 ug/kg/min via INTRAVENOUS

## 2023-05-06 MED ORDER — HYDROMORPHONE HCL 1 MG/ML IJ SOLN
INTRAMUSCULAR | Status: DC | PRN
  Administered 2023-05-06: .5 mg via INTRAVENOUS

## 2023-05-06 MED ORDER — ONDANSETRON HCL 4 MG/2ML IJ SOLN
INTRAMUSCULAR | Status: AC
  Filled 2023-05-06: qty 2

## 2023-05-06 MED ORDER — PROPOFOL 10 MG/ML IV BOLUS
INTRAVENOUS | Status: AC
  Filled 2023-05-06: qty 20

## 2023-05-06 MED ORDER — AMISULPRIDE (ANTIEMETIC) 5 MG/2ML IV SOLN: 10.0000 mg | Freq: Once | INTRAVENOUS | Status: DC | PRN

## 2023-05-06 MED ORDER — CHLORHEXIDINE GLUCONATE 0.12 % MT SOLN
OROMUCOSAL | Status: AC
  Administered 2023-05-06: 15 mL via OROMUCOSAL
  Filled 2023-05-06: qty 15

## 2023-05-06 MED ORDER — CEFAZOLIN SODIUM-DEXTROSE 2-4 GM/100ML-% IV SOLN
INTRAVENOUS | Status: AC
  Filled 2023-05-06: qty 100

## 2023-05-06 MED ORDER — CEFAZOLIN SODIUM-DEXTROSE 2-4 GM/100ML-% IV SOLN
2.0000 g | INTRAVENOUS | Status: AC
  Administered 2023-05-06: 2 g via INTRAVENOUS

## 2023-05-06 MED ORDER — FENTANYL CITRATE (PF) 250 MCG/5ML IJ SOLN
INTRAMUSCULAR | Status: AC
  Filled 2023-05-06: qty 5

## 2023-05-06 MED ORDER — ACETAMINOPHEN 500 MG PO TABS
1000.0000 mg | ORAL_TABLET | Freq: Once | ORAL | Status: AC
  Administered 2023-05-06: 1000 mg via ORAL
  Filled 2023-05-06: qty 2

## 2023-05-06 MED ORDER — CHLORHEXIDINE GLUCONATE 0.12 % MT SOLN: 15.0000 mL | Freq: Once | OROMUCOSAL | Status: AC

## 2023-05-06 MED ORDER — ORAL CARE MOUTH RINSE: 15.0000 mL | Freq: Once | OROMUCOSAL | Status: AC

## 2023-05-06 SURGICAL SUPPLY — 71 items
ADH SKN CLS APL DERMABOND .7 (GAUZE/BANDAGES/DRESSINGS)
BAG COUNTER SPONGE SURGICOUNT (BAG) IMPLANT
BAG SPNG CNTER NS LX DISP (BAG)
BINDER BREAST 3XL (GAUZE/BANDAGES/DRESSINGS) IMPLANT
BINDER BREAST LRG (GAUZE/BANDAGES/DRESSINGS) IMPLANT
BINDER BREAST MEDIUM (GAUZE/BANDAGES/DRESSINGS) IMPLANT
BINDER BREAST XLRG (GAUZE/BANDAGES/DRESSINGS) IMPLANT
BINDER BREAST XXLRG (GAUZE/BANDAGES/DRESSINGS) IMPLANT
BLADE CLIPPER SURG (BLADE) IMPLANT
BLADE HEX COATED 2.75 (ELECTRODE) ×1 IMPLANT
BLADE SURG 10 STRL SS (BLADE) IMPLANT
BLADE SURG 15 STRL LF DISP TIS (BLADE) ×1 IMPLANT
BLADE SURG 15 STRL SS (BLADE)
BNDG GAUZE DERMACEA FLUFF 4 (GAUZE/BANDAGES/DRESSINGS) IMPLANT
BNDG GZE DERMACEA 4 6PLY (GAUZE/BANDAGES/DRESSINGS)
BRIEF MESH DISP LRG (UNDERPADS AND DIAPERS) IMPLANT
COVER BACK TABLE 60X90IN (DRAPES) ×1 IMPLANT
COVER MAYO STAND STRL (DRAPES) ×1 IMPLANT
COVER SURGICAL LIGHT HANDLE (MISCELLANEOUS) IMPLANT
DERMABOND ADVANCED .7 DNX12 (GAUZE/BANDAGES/DRESSINGS) IMPLANT
DRAIN CHANNEL 19F RND (DRAIN) IMPLANT
DRAIN RELI 100 BL SUC LF ST (DRAIN)
DRAPE CHEST BREAST 15X10 FENES (DRAPES) IMPLANT
DRAPE INCISE IOBAN 66X45 STRL (DRAPES) IMPLANT
DRAPE LAPAROTOMY 100X72 PEDS (DRAPES) IMPLANT
DRAPE SURG 17X23 STRL (DRAPES) IMPLANT
DRAPE U-SHAPE 76X120 STRL (DRAPES) IMPLANT
DRSG ADAPTIC 3X8 NADH LF (GAUZE/BANDAGES/DRESSINGS) IMPLANT
DRSG CUTIMED SORBACT 7X9 (GAUZE/BANDAGES/DRESSINGS) IMPLANT
DRSG EMULSION OIL 3X3 NADH (GAUZE/BANDAGES/DRESSINGS) IMPLANT
DRSG HYDROCOLLOID 4X4 (GAUZE/BANDAGES/DRESSINGS) IMPLANT
DRSG TEGADERM 4X4.75 (GAUZE/BANDAGES/DRESSINGS) IMPLANT
DRSG TELFA 3X8 NADH STRL (GAUZE/BANDAGES/DRESSINGS) IMPLANT
ELECT REM PT RETURN 9FT ADLT (ELECTROSURGICAL) ×1
ELECTRODE REM PT RTRN 9FT ADLT (ELECTROSURGICAL) ×1 IMPLANT
EVACUATOR SILICONE 100CC (DRAIN) IMPLANT
GAUZE PAD ABD 8X10 STRL (GAUZE/BANDAGES/DRESSINGS) IMPLANT
GAUZE SPONGE 4X4 12PLY STRL (GAUZE/BANDAGES/DRESSINGS) IMPLANT
GAUZE XEROFORM 5X9 LF (GAUZE/BANDAGES/DRESSINGS) IMPLANT
GLOVE BIO SURGEON STRL SZ 6.5 (GLOVE) ×2 IMPLANT
GOWN STRL REUS W/ TWL LRG LVL3 (GOWN DISPOSABLE) ×2 IMPLANT
GOWN STRL REUS W/TWL LRG LVL3 (GOWN DISPOSABLE) ×2
GRAFT MYRIAD 3 LAYER 7X10 (Graft) IMPLANT
KIT BASIN OR (CUSTOM PROCEDURE TRAY) ×1 IMPLANT
LEGGING LITHOTOMY PAIR STRL (DRAPES) IMPLANT
LUBRICANT JELLY ST 5GR 8946 (MISCELLANEOUS) IMPLANT
MARKER SKIN DUAL TIP RULER LAB (MISCELLANEOUS) IMPLANT
NDL HYPO 25X1 1.5 SAFETY (NEEDLE) ×1 IMPLANT
NEEDLE HYPO 25X1 1.5 SAFETY (NEEDLE)
NS IRRIG 1000ML POUR BTL (IV SOLUTION) ×1 IMPLANT
PACK GENERAL/GYN (CUSTOM PROCEDURE TRAY) ×1 IMPLANT
PENCIL SMOKE EVACUATOR (MISCELLANEOUS) ×1 IMPLANT
SHEET MEDIUM DRAPE 40X70 STRL (DRAPES) IMPLANT
SPIKE FLUID TRANSFER (MISCELLANEOUS) IMPLANT
SPONGE T-LAP 18X18 ~~LOC~~+RFID (SPONGE) ×1 IMPLANT
STAPLER VISISTAT 35W (STAPLE) IMPLANT
SUT MNCRL AB 3-0 PS2 18 (SUTURE) IMPLANT
SUT MNCRL AB 4-0 PS2 18 (SUTURE) IMPLANT
SUT MON AB 3-0 SH 27 (SUTURE) ×1
SUT MON AB 3-0 SH27 (SUTURE) IMPLANT
SUT MON AB 5-0 PS2 18 (SUTURE) IMPLANT
SUT PROLENE 4 0 SH DA (SUTURE) IMPLANT
SUT SILK 3 0 PS 1 (SUTURE) IMPLANT
SWAB COLLECTION DEVICE MRSA (MISCELLANEOUS) IMPLANT
SWAB CULTURE ESWAB REG 1ML (MISCELLANEOUS) IMPLANT
SYR BULB IRRIG 60ML STRL (SYRINGE) IMPLANT
SYR CONTROL 10ML LL (SYRINGE) ×1 IMPLANT
TOWEL GREEN STERILE FF (TOWEL DISPOSABLE) ×2 IMPLANT
TUBE CONNECTING 20X1/4 (TUBING) ×1 IMPLANT
UNDERPAD 30X36 HEAVY ABSORB (UNDERPADS AND DIAPERS) ×1 IMPLANT
YANKAUER SUCT BULB TIP NO VENT (SUCTIONS) ×1 IMPLANT

## 2023-05-06 NOTE — Discharge Instructions (Addendum)
Do not get the surgical site wet.   NO driving while taking pain medications   No heavy activities  Diet: Regular, Try to optimize nutrition with plenty of proteins and vegetables to improve healing.  Wound Care:  To lower / larger wound: Leave dressings in place for 48 hours. After 48 hours, remove ABD pads and gauze dressings. Leave Sorbact (green mesh) in place (it is sutured in place). Apply KY jelly, moistened saline gauze, dry gauze and ABD pad daily to wound daily.   To higher / smaller wound: Keep wound clean and dry. Apply gauze daily to wound or change if saturated.   Call doctor if any unusual problems occur such as pain, excessive bleeding, unrelieved nausea/vomiting, fever &/or chills  Follow-up appointment: Previously scheduled. Medications: Called in at your pre-op appointment. You may resume your Eliquis tomorrow. Take as directed on the label.

## 2023-05-06 NOTE — Anesthesia Postprocedure Evaluation (Signed)
Anesthesia Post Note  Patient: Elsa Wedell  Procedure(s) Performed: Examination of perineal wounds under anesthesia, placement of myriad and closure of perineal wounds as appropriate (Left: Groin) APPLICATION OF SKIN SUBSTITUTE (Left: Groin)     Patient location during evaluation: PACU Anesthesia Type: General Level of consciousness: awake and alert Pain management: pain level controlled Vital Signs Assessment: post-procedure vital signs reviewed and stable Respiratory status: spontaneous breathing, nonlabored ventilation and respiratory function stable Cardiovascular status: blood pressure returned to baseline Postop Assessment: no apparent nausea or vomiting Anesthetic complications: no   No notable events documented.  Last Vitals:  Vitals:   05/06/23 1600 05/06/23 1615  BP: 107/81 100/77  Pulse: 83 69  Resp: 16 11  Temp:  (!) 36.4 C  SpO2: 98% 100%    Last Pain:  Vitals:   05/06/23 1603  TempSrc:   PainSc: 2                  Shanda Howells
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
## 2023-05-06 NOTE — Interval H&P Note (Signed)
History and Physical Interval Note: No change in physical exam or indication for surgery. All questions answered to his satisfaction. Surgical site marked.  Will proceed with perineal debridement and wound management at his request  05/06/2023 2:27 PM  [name]  has presented today for surgery, with the diagnosis of Groin wound.  The various methods of treatment have been discussed with the patient and family. After consideration of risks, benefits and other options for treatment, the patient has consented to  Procedure(s): Examination under anesthesia, placement of myriad, closure of perineal wounds as appropriate (Left) APPLICATION OF SKIN SUBSTITUTE (Left) as a surgical intervention.  The patient's history has been reviewed, patient examined, no change in status, stable for surgery.  I have reviewed the patient's chart and labs.  Questions were answered to the patient's satisfaction.     Santiago Glad

## 2023-05-06 NOTE — Transfer of Care (Signed)
Immediate Anesthesia Transfer of Care Note  Patient: [name]  Procedure(s) Performed: Examination of perineal wounds under anesthesia, placement of myriad and closure of perineal wounds as appropriate (Left: Groin) APPLICATION OF SKIN SUBSTITUTE (Left: Groin)  Patient Location: PACU  Anesthesia Type:General  Level of Consciousness: awake, alert , oriented, patient cooperative, and responds to stimulation  Airway & Oxygen Therapy: Patient Spontanous Breathing  Post-op Assessment: Report given to RN and Post -op Vital signs reviewed and stable  Post vital signs: Reviewed and stable  Last Vitals:  Vitals Value Taken Time  BP 107/74 05/06/23 1554  Temp 36.7 C 05/06/23 1554  Pulse 83 05/06/23 1558  Resp 17 05/06/23 1558  SpO2 97 % 05/06/23 1558  Vitals shown include unfiled device data.  Last Pain:  Vitals:   05/06/23 1554  TempSrc: Oral  PainSc:      *report given to PACU RN for continued care and follow-up.    Complications: No notable events documented.

## 2023-05-06 NOTE — Anesthesia Procedure Notes (Signed)
Procedure Name: LMA Insertion Date/Time: 05/06/2023 2:50 PM  Performed by: Earlene Plater, CRNAPre-anesthesia Checklist: Patient identified, Emergency Drugs available, Suction available, Patient being monitored and Timeout performed Patient Re-evaluated:Patient Re-evaluated prior to induction Oxygen Delivery Method: Circle system utilized Preoxygenation: Pre-oxygenation with 100% oxygen Induction Type: IV induction Ventilation: Mask ventilation without difficulty LMA: LMA inserted LMA Size: 5.0 Number of attempts: 1 Placement Confirmation: positive ETCO2, breath sounds checked- equal and bilateral and CO2 detector Dental Injury: Teeth and Oropharynx as per pre-operative assessment

## 2023-05-07 ENCOUNTER — Encounter: Payer: Self-pay | Admitting: Plastic Surgery

## 2023-05-07 ENCOUNTER — Encounter (HOSPITAL_COMMUNITY): Payer: Self-pay | Admitting: Plastic Surgery

## 2023-05-08 ENCOUNTER — Encounter: Payer: Self-pay | Admitting: Student

## 2023-05-08 ENCOUNTER — Telehealth: Payer: Self-pay | Admitting: Plastic Surgery

## 2023-05-08 DIAGNOSIS — K5900 Constipation, unspecified: Secondary | ICD-10-CM

## 2023-05-08 DIAGNOSIS — S31109S Unspecified open wound of abdominal wall, unspecified quadrant without penetration into peritoneal cavity, sequela: Secondary | ICD-10-CM

## 2023-05-08 NOTE — Telephone Encounter (Signed)
Patient's sister, Ariez Nolette, called because she said Dr. Ladona Ridgel had mentioned after Arby's surgery to call him if Franisco had not used the restroom yet.  She was hoping Dr. Ladona Ridgel could provide some guidance on who he could see for this since he does not have a PCP yet.  Please call her at [phone].

## 2023-05-09 NOTE — Telephone Encounter (Signed)
Called patient's Sister [name] this a.m., discussed that we sent an urgent referral for establishing primary care.  I discussed with patient's sister that the front office staff is going to work on this and see if we are able to assist with him obtaining an earlier appointment.  Discussed in the meantime to attempt additional water enemas, use magnesium citrate and continue to increase fluid intake (water).    Patient reports that he feels as if he has a stool burden near the rectal opening, however does not report any abdominal pain at this time.  Discussed with patient and his sister to continue to monitor symptoms, if he develops worsening abdominal pain or has any concerns, may need to be evaluated in the emergency room for fecal disimpaction.  Recommended calling primary care offices in the area.  Discussed that an urgent referral has been placed and this may help them receive a PCP appointment sooner.  Recommend calling with questions or concerns, will update patient and sister if we receive any additional information about PCP referral.  They were agreeable to the plan.

## 2023-05-11 ENCOUNTER — Encounter (HOSPITAL_COMMUNITY): Payer: Self-pay

## 2023-05-11 ENCOUNTER — Emergency Department (HOSPITAL_COMMUNITY)
Admission: EM | Admit: 2023-05-11 | Discharge: 2023-05-12 | Disposition: A | Discharge status: Home / Self Care | Payer: Self-pay | Attending: Emergency Medicine | Admitting: Emergency Medicine

## 2023-05-11 DIAGNOSIS — K5900 Constipation, unspecified: Secondary | ICD-10-CM | POA: Insufficient documentation

## 2023-05-11 DIAGNOSIS — R110 Nausea: Secondary | ICD-10-CM | POA: Insufficient documentation

## 2023-05-11 DIAGNOSIS — Z7901 Long term (current) use of anticoagulants: Secondary | ICD-10-CM | POA: Insufficient documentation

## 2023-05-11 MED ORDER — SODIUM CHLORIDE 0.9 % IV BOLUS
1000.0000 mL | Freq: Once | INTRAVENOUS | Status: AC
  Administered 2023-05-12: 1000 mL via INTRAVENOUS

## 2023-05-11 NOTE — ED Triage Notes (Signed)
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

## 2023-05-11 NOTE — ED Provider Notes (Incomplete)
EMERGENCY DEPARTMENT AT [facility] Provider Note   CSN: [id number] Arrival date & time: 05/11/23  2232     History {Add pertinent medical, surgical, social history, OB history to HPI:1} Chief Complaint  Patient presents with  . Constipation    Jakorey Culleton is a 27 y.o. male.  HPI     Home Medications Prior to Admission medications   Medication Sig Start Date End Date Taking? Authorizing Provider  acetaminophen (TYLENOL) 500 MG tablet Take 500-1,000 mg by mouth every 6 (six) hours as needed for moderate pain.    [provider]  amoxicillin-clavulanate (AUGMENTIN) 500-125 MG tablet Take 1 tablet by mouth in the morning and at bedtime.    [provider]  apixaban (ELIQUIS) 2.5 MG TABS tablet Take 1 tablet (2.5 mg total) by mouth 2 (two) times daily. Patient not taking: Reported on 05/09/2023 04/04/23   Angiulli, Mcarthur Rossetti, PA-C  Baclofen 5 MG TABS Take 1 tablet (5 mg total) by mouth 3 (three) times daily. 05/01/23   Raulkar, Drema Pry, MD  docusate sodium (COLACE) 100 MG capsule Take 1 capsule (100 mg total) by mouth daily. Patient taking differently: Take 100 mg by mouth 2 (two) times daily. 04/02/23   Angiulli, Mcarthur Rossetti, PA-C  gabapentin (NEURONTIN) 400 MG capsule Take 1 capsule (400 mg total) by mouth 3 (three) times daily. 05/01/23   Raulkar, Drema Pry, MD  levofloxacin (LEVAQUIN) 750 MG tablet Take 750 mg by mouth daily. 05/07/23   [provider]  ondansetron (ZOFRAN) 4 MG tablet Take 1 tablet (4 mg total) by mouth every 8 (eight) hours as needed for up to 20 doses for nausea or vomiting. 04/23/23   Laurena Spies, PA-C  oxyCODONE (ROXICODONE) 5 MG immediate release tablet Take 1 tablet (5 mg total) by mouth every 6 (six) hours as needed for up to 20 doses for severe pain. 04/23/23   Laurena Spies, PA-C  polyethylene glycol (MIRALAX / GLYCOLAX) 17 g packet Take 17 g by mouth 2 (two) times daily. Patient taking differently: Take  17 g by mouth daily as needed for mild constipation. 04/01/23   Angiulli, Mcarthur Rossetti, PA-C      Allergies    Walnut    Review of Systems   Review of Systems  Physical Exam Updated Vital Signs BP 124/77 (BP Location: Left Arm)   Pulse 97   Temp 98.1 F (36.7 C) (Oral)   Resp 14   SpO2 96%  Physical Exam  ED Results / Procedures / Treatments   Labs (all labs ordered are listed, but only abnormal results are displayed) Labs Reviewed - No data to display  EKG None  Radiology No results found.  Procedures Procedures  {Document cardiac monitor, telemetry assessment procedure when appropriate:1}  Medications Ordered in ED Medications - No data to display  ED Course/ Medical Decision Making/ A&P   {   Click here for ABCD2, HEART and other calculatorsREFRESH Note before signing :1}                              Medical Decision Making  ***  {Document critical care time when appropriate:1} {Document review of labs and clinical decision tools ie heart score, Chads2Vasc2 etc:1}  {Document your independent review of radiology images, and any outside records:1} {Document your discussion with family members, caretakers, and with consultants:1} {Document social determinants of health affecting pt's care:1} {Document your decision  making why or why not admission, treatments were needed:1} Final Clinical Impression(s) / ED Diagnoses Final diagnoses:  None    Rx / DC Orders ED Discharge Orders     None

## 2023-05-11 NOTE — ED Provider Notes (Signed)
[facility] AT [facility] Provider Note   CSN: [id number] Arrival date & time: 05/11/23  2232     History  Chief Complaint  Patient presents with   Constipation    Maywood Warfel is a 27 y.o. male.  27 year old male with concern for abdominal pain and constipation. Patient reports minimal stool output for 2 weeks, has tried enemas and miralax at home without significant relief. Patient drank a bottle of magnesium citrate today, developed diffuse abdominal cramping and nausea but did not have a bowel movement.  Denies fevers or chills.  Currently has Ex-Fix on pelvis.  Also with indwelling suprapubic catheter.  No other complaints or concerns today.       Home Medications Prior to Admission medications   Medication Sig Start Date End Date Taking? Authorizing Provider  acetaminophen (TYLENOL) 500 MG tablet Take 500-1,000 mg by mouth every 6 (six) hours as needed for moderate pain.    [provider]  amoxicillin-clavulanate (AUGMENTIN) 500-125 MG tablet Take 1 tablet by mouth in the morning and at bedtime.    [provider]  apixaban (ELIQUIS) 2.5 MG TABS tablet Take 1 tablet (2.5 mg total) by mouth 2 (two) times daily. Patient not taking: Reported on 05/09/2023 04/04/23   Angiulli, Mcarthur Rossetti, PA-C  Baclofen 5 MG TABS Take 1 tablet (5 mg total) by mouth 3 (three) times daily. 05/01/23   Raulkar, Drema Pry, MD  docusate sodium (COLACE) 100 MG capsule Take 1 capsule (100 mg total) by mouth daily. Patient taking differently: Take 100 mg by mouth 2 (two) times daily. 04/02/23   Angiulli, Mcarthur Rossetti, PA-C  gabapentin (NEURONTIN) 400 MG capsule Take 1 capsule (400 mg total) by mouth 3 (three) times daily. 05/01/23   Raulkar, Drema Pry, MD  levofloxacin (LEVAQUIN) 750 MG tablet Take 750 mg by mouth daily. 05/07/23   [provider]  ondansetron (ZOFRAN) 4 MG tablet Take 1 tablet (4 mg total) by mouth every 8 (eight) hours as needed for up to 20  doses for nausea or vomiting. 04/23/23   Laurena Spies, PA-C  oxyCODONE (ROXICODONE) 5 MG immediate release tablet Take 1 tablet (5 mg total) by mouth every 6 (six) hours as needed for up to 20 doses for severe pain. 04/23/23   Laurena Spies, PA-C  polyethylene glycol (MIRALAX / GLYCOLAX) 17 g packet Take 17 g by mouth 2 (two) times daily. Patient taking differently: Take 17 g by mouth daily as needed for mild constipation. 04/01/23   Angiulli, Mcarthur Rossetti, PA-C      Allergies    Walnut    Review of Systems   Review of Systems Negative except as per HPI Physical Exam Updated Vital Signs BP 118/78 (BP Location: Left Arm)   Pulse 83   Temp 98.5 F (36.9 C) (Oral)   Resp 16   SpO2 100%  Physical Exam Vitals and nursing note reviewed.  Constitutional:      General: He is not in acute distress.    Appearance: He is well-developed. He is not diaphoretic.  HENT:     Head: Normocephalic and atraumatic.  Cardiovascular:     Rate and Rhythm: Normal rate and regular rhythm.     Heart sounds: Normal heart sounds.  Pulmonary:     Effort: Pulmonary effort is normal.     Breath sounds: Normal breath sounds.  Abdominal:     Tenderness: There is generalized abdominal tenderness. There is guarding.  Comments: External fixator in place  Skin:    General: Skin is warm and dry.     Findings: No erythema or rash.  Neurological:     Mental Status: He is alert and oriented to person, place, and time.  Psychiatric:        Behavior: Behavior normal.     ED Results / Procedures / Treatments   Labs (all labs ordered are listed, but only abnormal results are displayed) Labs Reviewed  COMPREHENSIVE METABOLIC PANEL - Abnormal; Notable for the following components:      Result Value   Creatinine, Ser 0.58 (*)    Total Protein 8.2 (*)    All other components within normal limits  CBC WITH DIFFERENTIAL/PLATELET - Abnormal; Notable for the following components:   Hemoglobin 12.7 (*)     All other components within normal limits  LIPASE, BLOOD    EKG None  Radiology CT ABDOMEN PELVIS W CONTRAST  Result Date: 05/12/2023 CLINICAL DATA:  Abdominal pain, constipation EXAM: CT ABDOMEN AND PELVIS WITH CONTRAST TECHNIQUE: Multidetector CT imaging of the abdomen and pelvis was performed using the standard protocol following bolus administration of intravenous contrast. RADIATION DOSE REDUCTION: This exam was performed according to the departmental dose-optimization program which includes automated exposure control, adjustment of the mA and/or kV according to patient size and/or use of iterative reconstruction technique. CONTRAST:  75mL OMNIPAQUE IOHEXOL 350 MG/ML SOLN COMPARISON:  03/25/2023 FINDINGS: Lower chest: Mild bibasilar atelectasis. Hepatobiliary: Liver is within normal limits. Gallbladder is unremarkable. No intrahepatic or extrahepatic duct dilatation. Pancreas: Within normal limits. Spleen: Within normal limits. Adrenals/Urinary Tract: Adrenal glands are within normal limits. Kidneys are within normal limits.  No hydronephrosis. Decompressed bladder with suprapubic bladder catheter with stable position anterior to the widened pubic symphysis. Stomach/Bowel: Stomach is within normal limits. No evidence of bowel obstruction. Normal appendix (series 3/image 86). No colonic wall thickening or inflammatory changes. Mild to moderate colonic stool burden, including a mildly increased rectal stool burden (series 3/image 81) suggesting mild constipation. Vascular/Lymphatic: No evidence of abdominal aortic aneurysm. No suspicious abdominopelvic lymphadenopathy. Reproductive: Prostate is grossly unremarkable. Other: No abdominopelvic ascites. Musculoskeletal: Lumbar spine is within normal limits. Bilateral sacroiliac fusion. Old bilateral acetabular and pelvic ring fractures, widening of the pubic symphysis and associated nonunited/displaced left inferior pubic ramus fracture. Associated  postprocedural changes in the bilateral inguinal regions and perineum. Surgical dressing along the left scrotum and medial upper thigh (series 3/image 1). No drainable fluid collection/abscess. IMPRESSION: Suspected mild constipation. Posttraumatic changes involving the pelvis, as described above. No drainable fluid collection/abscess. Stable indwelling suprapubic bladder catheter, as above. Electronically Signed   By: Charline Bills M.D.   On: 05/12/2023 01:51    Procedures Fecal disimpaction  Date/Time: 05/12/2023 3:44 AM  Performed by: Jeannie Fend, PA-C Authorized by: Jeannie Fend, PA-C  Consent: Verbal consent obtained. Written consent not obtained. Risks and benefits: risks, benefits and alternatives were discussed Consent given by: patient Patient identity confirmed: verbally with patient Local anesthesia used: no  Anesthesia: Local anesthesia used: no  Sedation: Patient sedated: no  Patient tolerance: patient tolerated the procedure well with no immediate complications       Medications Ordered in ED Medications  sodium chloride 0.9 % bolus 1,000 mL (0 mLs Intravenous Stopped 05/12/23 0253)  iohexol (OMNIPAQUE) 350 MG/ML injection 75 mL (75 mLs Intravenous Contrast Given 05/12/23 0138)  sodium phosphate (FLEET) 7-19 GM/118ML enema 1 enema (1 enema Rectal Given 05/12/23 0253)    ED  Course/ Medical Decision Making/ A&P                                 Medical Decision Making Amount and/or Complexity of Data Reviewed Labs: ordered. Radiology: ordered.  Risk OTC drugs. Prescription drug management.   This patient presents to the ED for concern of constipation, abdominal pain, this involves an extensive number of treatment options, and is a complaint that carries with it a high risk of complications and morbidity.  The differential diagnosis includes but not limited to constipation, intraabdominal infection, bowel obstruction     Co morbidities that complicate  the patient evaluation  Polytrauma/pelvic fracture with external fixator in place with scheduled surgery later this week.    Additional history obtained:  Additional history obtained from family at bedside who contributes to history as above External records from outside source obtained and reviewed including prior imaging and labs on file   Lab Tests:  I Ordered, and personally interpreted labs.  The pertinent results include: CBC without significant findings.  Lipase normal.  CMP without significant findings.   Imaging Studies ordered:  I ordered imaging studies including CT abdomen pelvis I independently visualized and interpreted imaging which showed constipation I agree with the radiologist interpretation   Consultations Obtained:  I requested consultation with the ER attending, Dr. Clayborne Dana,  and discussed lab and imaging findings as well as pertinent plan - they recommend: Agrees with plan of care   Problem List / ED Course / Critical interventions / Medication management  27 year old male brought in by family with concern for constipation.  Patient has had very little stool output for the past 2 weeks despite treating at home with MiraLAX, stool softeners, enemas.  He reports feeling uncomfortable today after taking magnesium citrate.  On exam, he is diffusely tender and guarding.  His labs are reassuring.  His CT shows constipation.  Patient is agreeable with disimpaction, was able to remove a small amount of stool before patient requested termination of procedure.  He was agreeable with Fleet enema and after enema did have successful bowel movement.  Patient is discharged with plan to follow-up with his care team. I ordered medication including enema for constipation Reevaluation of the patient after these medicines showed that the patient improved I have reviewed the patients home medicines and have made adjustments as needed   Social Determinants of Health:  Lives with  family   Test / Admission - Considered:  Stable for discharge         Final Clinical Impression(s) / ED Diagnoses Final diagnoses:  Constipation, unspecified constipation type    Rx / DC Orders ED Discharge Orders     None         Jeannie Fend, PA-C 05/12/23 0344    Marily Memos, MD 05/12/23 2303

## 2023-05-12 ENCOUNTER — Other Ambulatory Visit: Payer: Self-pay

## 2023-05-12 ENCOUNTER — Encounter (HOSPITAL_COMMUNITY): Payer: Self-pay | Admitting: Orthopedic Surgery

## 2023-05-12 ENCOUNTER — Emergency Department (HOSPITAL_COMMUNITY): Payer: Self-pay

## 2023-05-12 MED ORDER — FLEET ENEMA 7-19 GM/118ML RE ENEM
1.0000 | ENEMA | Freq: Once | RECTAL | Status: AC
  Administered 2023-05-12: 1 via RECTAL
  Filled 2023-05-12: qty 1

## 2023-05-12 MED ORDER — IOHEXOL 350 MG/ML SOLN
75.0000 mL | Freq: Once | INTRAVENOUS | Status: AC | PRN
  Administered 2023-05-12: 75 mL via INTRAVENOUS

## 2023-05-12 NOTE — Discharge Instructions (Addendum)
Continue with stool softener and laxative at home. Adjust frequency of medications based on stools.

## 2023-05-12 NOTE — Progress Notes (Signed)
SDW CALL  Patient was given pre-op instructions over the phone. The opportunity was given for the patient to ask questions. No further questions asked. Patient verbalized understanding of instructions given.   PCP - pt has appt with new Cathlyn Parsons NP on Wed. Aug.14. Cardiologist - denies Urologist - Dr. Ileene Rubens [facility]  PPM/ICD -denies Device Orders -  Rep Notified -   Chest x-ray - na EKG - na Stress Test - none ECHO - none Cardiac Cath - none  Sleep Study - no CPAP -   Fasting Blood Sugar - na Checks Blood Sugar _____ times a day  Blood Thinner Instructions:na Aspirin Instructions:na  ERAS Protcol -no  COVID TEST- na   Anesthesia review: yes- pt taking Augmentin for bladder infection. Reports it was prescribed 7/31 and he started taking it 8/2. Will finish 8/14.  Taking Levaquin for left leg wound infection prescribed by Dr. Magdalene Patricia office. Also, pt reports his throat "feels a little raspy"  after being in the ER until 5:00am today for constipation but denies any other respiratory,cold,flu or Covid symptoms.   Patient denies shortness of breath, fever, cough and chest pain over the phone call    Surgical Instructions    Your procedure is scheduled on Tuesday August 13.  Report to [facility] Main Entrance "A" at 5:30 A.M., then check in with the Admitting office.  Call this number if you have problems the morning of surgery:  [phone]    Remember:  Do not eat or drink after midnight the night before your surgery     Take these medicines the morning of surgery with A SIP OF WATER: Baclofen,Gabapentin PRN: Tylenol,Zofran,Oxycodone   As of today, STOP taking any Aspirin (unless otherwise instructed by your surgeon) Aleve, Naproxen, Ibuprofen, Motrin, Advil, Goody's, BC's, all herbal medications, fish oil, and all vitamins.  [facility] is not responsible for any belongings or valuables. .   Do NOT Smoke (Tobacco/Vaping)  24 hours prior to your  procedure  If you use a CPAP at night, you may bring your mask for your overnight stay.   Contacts, glasses, hearing aids, dentures or partials may not be worn into surgery, please bring cases for these belongings   Patients discharged the day of surgery will not be allowed to drive home, and someone needs to stay with them for 24 hours.   SURGICAL WAITING ROOM VISITATION You may have 1 visitor in the pre-op area at a time determined by the pre-op nurse. (Visitor may not switch out) Patients having surgery or a procedure in a hospital may have two support people in the waiting room. Children under the age of 65 must have an adult with them who is not the patient. They may stay in the waiting area during the procedure and may switch out with other visitors. If the patient needs to stay at the hospital during part of their recovery, the visitor guidelines for inpatient rooms apply.  Please refer to the [facility] website for the visitor guidelines for Inpatients (after your surgery is over and you are in a regular room).     Special instructions:    Oral Hygiene is also important to reduce your risk of infection.  Remember - BRUSH YOUR TEETH THE MORNING OF SURGERY WITH YOUR REGULAR TOOTHPASTE   Day of Surgery:  Take a shower the day of or night before with antibacterial soap. Wear Clean/Comfortable clothing the morning of surgery Do not apply any deodorants/lotions.   Do not wear  jewelry or makeup Do not wear lotions, powders, perfumes/colognes, or deodorant. Do not shave 48 hours prior to surgery.  Men may shave face and neck. Do not bring valuables to the hospital. Do not wear nail polish, gel polish, artificial nails, or any other type of covering on natural nails (fingers and toes) If you have artificial nails or gel coating that need to be removed by a nail salon, please have this removed prior to surgery. Artificial nails or gel coating may interfere with anesthesia's ability to  adequately monitor your vital signs. Remember to brush your teeth WITH YOUR REGULAR TOOTHPASTE.

## 2023-05-12 NOTE — Brief Op Note (Signed)
#  [id number] 
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
## 2023-05-13 ENCOUNTER — Other Ambulatory Visit: Payer: Self-pay

## 2023-05-13 ENCOUNTER — Ambulatory Visit (HOSPITAL_COMMUNITY)
Admission: RE | Admit: 2023-05-13 | Discharge: 2023-05-13 | Disposition: A | Discharge status: Home / Self Care | Payer: Self-pay | Attending: Orthopedic Surgery | Admitting: Orthopedic Surgery

## 2023-05-13 ENCOUNTER — Ambulatory Visit (HOSPITAL_BASED_OUTPATIENT_CLINIC_OR_DEPARTMENT_OTHER): Payer: Self-pay | Admitting: Anesthesiology

## 2023-05-13 ENCOUNTER — Encounter (HOSPITAL_COMMUNITY): Payer: Self-pay | Admitting: Orthopedic Surgery

## 2023-05-13 ENCOUNTER — Ambulatory Visit (HOSPITAL_COMMUNITY): Payer: Self-pay | Admitting: Anesthesiology

## 2023-05-13 ENCOUNTER — Ambulatory Visit (HOSPITAL_COMMUNITY): Payer: Self-pay

## 2023-05-13 ENCOUNTER — Encounter (HOSPITAL_COMMUNITY)
Admission: RE | Disposition: A | Discharge status: Home / Self Care | Payer: Self-pay | Source: Home / Self Care | Attending: Orthopedic Surgery

## 2023-05-13 DIAGNOSIS — S81802D Unspecified open wound, left lower leg, subsequent encounter: Secondary | ICD-10-CM | POA: Insufficient documentation

## 2023-05-13 DIAGNOSIS — Z4589 Encounter for adjustment and management of other implanted devices: Secondary | ICD-10-CM | POA: Insufficient documentation

## 2023-05-13 DIAGNOSIS — Z449 Encounter for fitting and adjustment of unspecified external prosthetic device: Secondary | ICD-10-CM

## 2023-05-13 DIAGNOSIS — Z01818 Encounter for other preprocedural examination: Secondary | ICD-10-CM

## 2023-05-13 HISTORY — DX: Pneumonia, unspecified organism: J18.9

## 2023-05-13 HISTORY — PX: EXTERNAL FIXATION REMOVAL: SHX5040

## 2023-05-13 LAB — CBC WITH DIFFERENTIAL/PLATELET
Abs Immature Granulocytes: 0.03 10*3/uL (ref 0.00–0.07)
Basophils Absolute: 0 10*3/uL (ref 0.0–0.1)
Basophils Relative: 1 %
Eosinophils Absolute: 0.1 10*3/uL (ref 0.0–0.5)
Eosinophils Relative: 1 %
HCT: 42.9 % (ref 39.0–52.0)
Hemoglobin: 13.2 g/dL (ref 13.0–17.0)
Immature Granulocytes: 0 %
Lymphocytes Relative: 17 %
Lymphs Abs: 1.2 10*3/uL (ref 0.7–4.0)
MCH: 26.2 pg (ref 26.0–34.0)
MCHC: 30.8 g/dL (ref 30.0–36.0)
MCV: 85.1 fL (ref 80.0–100.0)
Monocytes Absolute: 0.6 10*3/uL (ref 0.1–1.0)
Monocytes Relative: 9 %
Neutro Abs: 5.3 10*3/uL (ref 1.7–7.7)
Neutrophils Relative %: 72 %
Platelets: 338 10*3/uL (ref 150–400)
RBC: 5.04 MIL/uL (ref 4.22–5.81)
RDW: 14.6 % (ref 11.5–15.5)
WBC: 7.3 10*3/uL (ref 4.0–10.5)
nRBC: 0 % (ref 0.0–0.2)

## 2023-05-13 LAB — COMPREHENSIVE METABOLIC PANEL
ALT: 27 U/L (ref 0–44)
AST: 20 U/L (ref 15–41)
Albumin: 4 g/dL (ref 3.5–5.0)
Alkaline Phosphatase: 76 U/L (ref 38–126)
Anion gap: 12 (ref 5–15)
BUN: 8 mg/dL (ref 6–20)
CO2: 23 mmol/L (ref 22–32)
Calcium: 9.1 mg/dL (ref 8.9–10.3)
Chloride: 99 mmol/L (ref 98–111)
Creatinine, Ser: 0.61 mg/dL (ref 0.61–1.24)
GFR, Estimated: >60 mL/min (ref >60)
Glucose, Bld: 103 mg/dL — ABNORMAL HIGH (ref 70–99)
Potassium: 3.1 mmol/L — ABNORMAL LOW (ref 3.5–5.1)
Sodium: 134 mmol/L — ABNORMAL LOW (ref 135–145)
Total Bilirubin: 0.4 mg/dL (ref 0.3–1.2)
Total Protein: 7.7 g/dL (ref 6.5–8.1)

## 2023-05-13 LAB — C-REACTIVE PROTEIN: CRP: 0.8 mg/dL (ref <1.0)

## 2023-05-13 LAB — SEDIMENTATION RATE: Sed Rate: 30 mm/hr — ABNORMAL HIGH (ref 0–16)

## 2023-05-13 SURGERY — REMOVAL, EXTERNAL FIXATION DEVICE, LOWER EXTREMITY
Anesthesia: General

## 2023-05-13 MED ORDER — HYDROMORPHONE HCL 1 MG/ML IJ SOLN
INTRAMUSCULAR | Status: AC
  Filled 2023-05-13: qty 1

## 2023-05-13 MED ORDER — DEXAMETHASONE SODIUM PHOSPHATE 10 MG/ML IJ SOLN
INTRAMUSCULAR | Status: AC
  Filled 2023-05-13: qty 1

## 2023-05-13 MED ORDER — CHLORHEXIDINE GLUCONATE 0.12 % MT SOLN
15.0000 mL | Freq: Once | OROMUCOSAL | Status: AC
  Administered 2023-05-13: 15 mL via OROMUCOSAL
  Filled 2023-05-13: qty 15

## 2023-05-13 MED ORDER — TRAMADOL HCL 50 MG PO TABS: 50.0000 mg | ORAL_TABLET | Freq: Three times a day (TID) | ORAL | 0 refills | Status: DC | PRN

## 2023-05-13 MED ORDER — 0.9 % SODIUM CHLORIDE (POUR BTL) OPTIME
TOPICAL | Status: DC | PRN
  Administered 2023-05-13: 1000 mL

## 2023-05-13 MED ORDER — OXYCODONE HCL 5 MG/5ML PO SOLN: 5.0000 mg | Freq: Once | ORAL | Status: DC | PRN

## 2023-05-13 MED ORDER — FENTANYL CITRATE (PF) 250 MCG/5ML IJ SOLN
INTRAMUSCULAR | Status: AC
  Filled 2023-05-13: qty 5

## 2023-05-13 MED ORDER — HYDROMORPHONE HCL 1 MG/ML IJ SOLN
0.2500 mg | INTRAMUSCULAR | Status: DC | PRN
  Administered 2023-05-13 (×2): 0.5 mg via INTRAVENOUS

## 2023-05-13 MED ORDER — PROPOFOL 10 MG/ML IV BOLUS
INTRAVENOUS | Status: AC
  Filled 2023-05-13: qty 20

## 2023-05-13 MED ORDER — MIDAZOLAM HCL 2 MG/2ML IJ SOLN
INTRAMUSCULAR | Status: AC
  Filled 2023-05-13: qty 2

## 2023-05-13 MED ORDER — OXYCODONE HCL 5 MG PO TABS: 5.0000 mg | ORAL_TABLET | Freq: Once | ORAL | Status: DC | PRN

## 2023-05-13 MED ORDER — FENTANYL CITRATE (PF) 250 MCG/5ML IJ SOLN
INTRAMUSCULAR | Status: DC | PRN
  Administered 2023-05-13: 50 ug via INTRAVENOUS
  Administered 2023-05-13: 25 ug via INTRAVENOUS
  Administered 2023-05-13: 50 ug via INTRAVENOUS

## 2023-05-13 MED ORDER — ONDANSETRON HCL 4 MG/2ML IJ SOLN
INTRAMUSCULAR | Status: DC | PRN
  Administered 2023-05-13: 4 mg via INTRAVENOUS

## 2023-05-13 MED ORDER — PROMETHAZINE HCL 25 MG/ML IJ SOLN: 6.2500 mg | INTRAMUSCULAR | Status: DC | PRN

## 2023-05-13 MED ORDER — DEXAMETHASONE SODIUM PHOSPHATE 10 MG/ML IJ SOLN
INTRAMUSCULAR | Status: DC | PRN
  Administered 2023-05-13: 5 mg via INTRAVENOUS

## 2023-05-13 MED ORDER — LACTATED RINGERS IV SOLN: INTRAVENOUS | Status: DC

## 2023-05-13 MED ORDER — MIDAZOLAM HCL 5 MG/5ML IJ SOLN
INTRAMUSCULAR | Status: DC | PRN
  Administered 2023-05-13: 2 mg via INTRAVENOUS

## 2023-05-13 MED ORDER — KETOROLAC TROMETHAMINE 10 MG PO TABS: 10.0000 mg | ORAL_TABLET | Freq: Four times a day (QID) | ORAL | 0 refills | Status: DC | PRN

## 2023-05-13 MED ORDER — LIDOCAINE 2% (20 MG/ML) 5 ML SYRINGE
INTRAMUSCULAR | Status: AC
  Filled 2023-05-13: qty 5

## 2023-05-13 MED ORDER — MEPERIDINE HCL 25 MG/ML IJ SOLN: 6.2500 mg | INTRAMUSCULAR | Status: DC | PRN

## 2023-05-13 MED ORDER — AMISULPRIDE (ANTIEMETIC) 5 MG/2ML IV SOLN: 10.0000 mg | Freq: Once | INTRAVENOUS | Status: DC | PRN

## 2023-05-13 MED ORDER — ACETAMINOPHEN 500 MG PO TABS
1000.0000 mg | ORAL_TABLET | Freq: Once | ORAL | Status: AC
  Administered 2023-05-13: 1000 mg via ORAL
  Filled 2023-05-13: qty 2

## 2023-05-13 MED ORDER — PROPOFOL 10 MG/ML IV BOLUS
INTRAVENOUS | Status: DC | PRN
Start: 2023-05-13 — End: 2023-05-13
  Administered 2023-05-13: 150 mg via INTRAVENOUS

## 2023-05-13 MED ORDER — ONDANSETRON HCL 4 MG/2ML IJ SOLN
INTRAMUSCULAR | Status: AC
  Filled 2023-05-13: qty 2

## 2023-05-13 MED ORDER — ORAL CARE MOUTH RINSE: 15.0000 mL | Freq: Once | OROMUCOSAL | Status: AC

## 2023-05-13 MED ORDER — CEFAZOLIN SODIUM-DEXTROSE 2-4 GM/100ML-% IV SOLN
2.0000 g | INTRAVENOUS | Status: AC
  Administered 2023-05-13: 2 g via INTRAVENOUS
  Filled 2023-05-13: qty 100

## 2023-05-13 MED ORDER — VANCOMYCIN HCL 1000 MG IV SOLR
INTRAVENOUS | Status: AC
  Filled 2023-05-13: qty 20

## 2023-05-13 MED ORDER — LIDOCAINE 2% (20 MG/ML) 5 ML SYRINGE
INTRAMUSCULAR | Status: DC | PRN
  Administered 2023-05-13: 70 mg via INTRAVENOUS

## 2023-05-13 SURGICAL SUPPLY — 40 items
BAG COUNTER SPONGE SURGICOUNT (BAG) ×1 IMPLANT
BAG SPNG CNTER NS LX DISP (BAG) ×1
BNDG ELASTIC 4X5.8 VLCR STR LF (GAUZE/BANDAGES/DRESSINGS) ×1 IMPLANT
BNDG ELASTIC 6X5.8 VLCR STR LF (GAUZE/BANDAGES/DRESSINGS) ×1 IMPLANT
BNDG GAUZE DERMACEA FLUFF 4 (GAUZE/BANDAGES/DRESSINGS) ×2 IMPLANT
BNDG GZE DERMACEA 4 6PLY (GAUZE/BANDAGES/DRESSINGS)
BRUSH SCRUB EZ PLAIN DRY (MISCELLANEOUS) ×2 IMPLANT
COVER SURGICAL LIGHT HANDLE (MISCELLANEOUS) ×2 IMPLANT
DRAPE C-ARM 42X72 X-RAY (DRAPES) IMPLANT
DRAPE C-ARMOR (DRAPES) ×1 IMPLANT
DRAPE U-SHAPE 47X51 STRL (DRAPES) ×1 IMPLANT
DRESSING MEPILEX FLEX 4X4 (GAUZE/BANDAGES/DRESSINGS) IMPLANT
DRSG ADAPTIC 3X8 NADH LF (GAUZE/BANDAGES/DRESSINGS) ×1 IMPLANT
DRSG MEPILEX FLEX 4X4 (GAUZE/BANDAGES/DRESSINGS) ×2
ELECT REM PT RETURN 9FT ADLT (ELECTROSURGICAL)
ELECTRODE REM PT RTRN 9FT ADLT (ELECTROSURGICAL) ×1 IMPLANT
GAUZE PACKING IODOFORM 1/4X15 (PACKING) IMPLANT
GAUZE SPONGE 4X4 12PLY STRL (GAUZE/BANDAGES/DRESSINGS) ×1 IMPLANT
GLOVE BIO SURGEON STRL SZ7.5 (GLOVE) ×1 IMPLANT
GLOVE BIO SURGEON STRL SZ8 (GLOVE) ×1 IMPLANT
GLOVE BIOGEL PI IND STRL 7.5 (GLOVE) ×1 IMPLANT
GLOVE BIOGEL PI IND STRL 8 (GLOVE) ×1 IMPLANT
GLOVE SURG ORTHO LTX SZ7.5 (GLOVE) ×2 IMPLANT
GOWN STRL REUS W/ TWL LRG LVL3 (GOWN DISPOSABLE) ×2 IMPLANT
GOWN STRL REUS W/ TWL XL LVL3 (GOWN DISPOSABLE) ×1 IMPLANT
GOWN STRL REUS W/TWL LRG LVL3 (GOWN DISPOSABLE) ×2
GOWN STRL REUS W/TWL XL LVL3 (GOWN DISPOSABLE) ×1
KIT BASIN OR (CUSTOM PROCEDURE TRAY) ×1 IMPLANT
KIT TURNOVER KIT B (KITS) ×1 IMPLANT
MANIFOLD NEPTUNE II (INSTRUMENTS) ×1 IMPLANT
NS IRRIG 1000ML POUR BTL (IV SOLUTION) ×1 IMPLANT
PACK ORTHO EXTREMITY (CUSTOM PROCEDURE TRAY) ×1 IMPLANT
PAD ARMBOARD 7.5X6 YLW CONV (MISCELLANEOUS) ×2 IMPLANT
PADDING CAST COTTON 6X4 STRL (CAST SUPPLIES) ×3 IMPLANT
SPONGE T-LAP 18X18 ~~LOC~~+RFID (SPONGE) ×1 IMPLANT
STAPLER VISISTAT 35W (STAPLE) IMPLANT
TOWEL GREEN STERILE (TOWEL DISPOSABLE) ×2 IMPLANT
TOWEL GREEN STERILE FF (TOWEL DISPOSABLE) ×2 IMPLANT
UNDERPAD 30X36 HEAVY ABSORB (UNDERPADS AND DIAPERS) ×1 IMPLANT
WATER STERILE IRR 1000ML POUR (IV SOLUTION) ×2 IMPLANT

## 2023-05-13 NOTE — Transfer of Care (Signed)
Immediate Anesthesia Transfer of Care Note  Patient: Alix Dedios  Procedure(s) Performed: REMOVAL EXTERNAL FIXATION PELVIS, CURETTAGE OF PIN SITES  Patient Location: PACU  Anesthesia Type:General  Level of Consciousness: awake, alert , oriented, and patient cooperative  Airway & Oxygen Therapy: Patient Spontanous Breathing  Post-op Assessment: Report given to RN and Post -op Vital signs reviewed and stable  Post vital signs: Reviewed and stable  Last Vitals:  Vitals Value Taken Time  BP 118/74 05/13/23 0922  Temp    Pulse 86 05/13/23 0924  Resp 15 05/13/23 0924  SpO2 100 % 05/13/23 0924  Vitals shown include unfiled device data.  Last Pain:  Vitals:   05/13/23 0605  TempSrc: Oral  PainSc: 0-No pain         Complications: No notable events documented.

## 2023-05-13 NOTE — Discharge Instructions (Signed)
[facility] Discharge Instructions   General Discharge Instructions   WEIGHT BEARING STATUS: weightbearing as tolerated Bilateral lower extremities   RANGE OF MOTION/ACTIVITY: unrestricted motion all joints   Wound Care: daily dressing changes as needed starting on 05/15/2023. See below    Discharge Wound Care Instructions  Do NOT apply any ointments, solutions or lotions to pin sites or surgical wounds.  These prevent needed drainage and even though solutions like hydrogen peroxide kill bacteria, they also damage cells lining the pin sites that help fight infection.  Applying lotions or ointments can keep the wounds moist and can cause them to breakdown and open up as well. This can increase the risk for infection. When in doubt call the office.  Surgical incisions should be dressed daily.  If any drainage is noted, use one layer of adaptic or Mepitel, then gauze and tape. Alternatively you can use a silicone foam dressing such as a mepilex   [email or web address] [email or web address]-[email or web address]/?pd_[email or web address]&[email or web address]  [email or web address]  These dressing supplies should be available at local medical supply stores ([employer], [employer] [facility], etc). They are not usually carried at places like [facility], [facility], [employer], etc  Once the incision is completely dry and without drainage, it may be left open to air out.  Showering may begin 36-48 hours later.  Cleaning gently with soap and water.  Diet: as you were eating previously.  Can use over the counter stool softeners and bowel preparations, such as Miralax, to help with bowel movements.  Narcotics can be constipating.  Be sure to drink plenty of fluids  PAIN MEDICATION USE AND EXPECTATIONS  You have likely been given narcotic medications  to help control your pain.  After a traumatic event that results in an fracture (broken bone) with or without surgery, it is ok to use narcotic pain medications to help control one's pain.  We understand that everyone responds to pain differently and each individual patient will be evaluated on a regular basis for the continued need for narcotic medications. Ideally, narcotic medication use should last no more than 6-8 weeks (coinciding with fracture healing).   As a patient it is your responsibility as well to monitor narcotic medication use and report the amount and frequency you use these medications when you come to your office visit.   We would also advise that if you are using narcotic medications, you should take a dose prior to therapy to maximize you participation.  IF YOU ARE ON NARCOTIC MEDICATIONS IT IS NOT PERMISSIBLE TO OPERATE A MOTOR VEHICLE (MOTORCYCLE/CAR/TRUCK/MOPED) OR HEAVY MACHINERY DO NOT MIX NARCOTICS WITH OTHER CNS (CENTRAL NERVOUS SYSTEM) DEPRESSANTS SUCH AS ALCOHOL   POST-OPERATIVE OPIOID TAPER INSTRUCTIONS: It is important to wean off of your opioid medication as soon as possible. If you do not need pain medication after your surgery it is ok to stop day one. Opioids include: Codeine, Hydrocodone(Norco, Vicodin), Oxycodone(Percocet, oxycontin) and hydromorphone amongst others.  Long term and even short term use of opiods can cause: Increased pain response Dependence Constipation Depression Respiratory depression And more.  Withdrawal symptoms can include Flu like symptoms Nausea, vomiting And more Techniques to manage these symptoms Hydrate well Eat regular healthy meals Stay active Use relaxation techniques(deep breathing, meditating, yoga) Do Not substitute Alcohol to help with tapering If you have been on opioids for less than two weeks and do not have pain than it is ok to stop all together.  Plan to wean off of opioids This plan should start within one  week  post op of your fracture surgery  Maintain the same interval or time between taking each dose and first decrease the dose.  Cut the total daily intake of opioids by one tablet each day Next start to increase the time between doses. The last dose that should be eliminated is the evening dose.    STOP SMOKING OR USING NICOTINE PRODUCTS!!!!  As discussed nicotine severely impairs your body's ability to heal surgical and traumatic wounds but also impairs bone healing.  Wounds and bone heal by forming microscopic blood vessels (angiogenesis) and nicotine is a vasoconstrictor (essentially, shrinks blood vessels).  Therefore, if vasoconstriction occurs to these microscopic blood vessels they essentially disappear and are unable to deliver necessary nutrients to the healing tissue.  This is one modifiable factor that you can do to dramatically increase your chances of healing your injury.    (This means no smoking, no nicotine gum, patches, etc)  DO NOT USE NONSTEROIDAL ANTI-INFLAMMATORY DRUGS (NSAID'S)  Using products such as Advil (ibuprofen), Aleve (naproxen), Motrin (ibuprofen) for additional pain control during fracture healing can delay and/or prevent the healing response.  If you would like to take over the counter (OTC) medication, Tylenol (acetaminophen) is ok.  However, some narcotic medications that are given for pain control contain acetaminophen as well. Therefore, you should not exceed more than 4000 mg of tylenol in a day if you do not have liver disease.  Also note that there are may OTC medicines, such as cold medicines and allergy medicines that my contain tylenol as well.  If you have any questions about medications and/or interactions please ask your doctor/PA or your pharmacist.      ICE AND ELEVATE INJURED/OPERATIVE EXTREMITY  Using ice and elevating the injured extremity above your heart can help with swelling and pain control.  Icing in a pulsatile fashion, such as 20 minutes on  and 20 minutes off, can be followed.    Do not place ice directly on skin. Make sure there is a barrier between to skin and the ice pack.    Using frozen items such as frozen peas works well as the conform nicely to the are that needs to be iced.  USE AN ACE WRAP OR TED HOSE FOR SWELLING CONTROL  In addition to icing and elevation, Ace wraps or TED hose are used to help limit and resolve swelling.  It is recommended to use Ace wraps or TED hose until you are informed to stop.    When using Ace Wraps start the wrapping distally (farthest away from the body) and wrap proximally (closer to the body)   Example: If you had surgery on your leg or thing and you do not have a splint on, start the ace wrap at the toes and work your way up to the thigh        If you had surgery on your upper extremity and do not have a splint on, start the ace wrap at your fingers and work your way up to the upper arm  IF YOU ARE IN A SPLINT OR CAST DO NOT REMOVE IT FOR ANY REASON   If your splint gets wet for any reason please contact the office immediately. You may shower in your splint or cast as long as you keep it dry.  This can be done by wrapping in a cast cover or garbage back (or similar)  Do Not stick any thing down your splint or cast such as pencils, money, or hangers to  try and scratch yourself with.  If you feel itchy take benadryl as prescribed on the bottle for itching  IF YOU ARE IN A CAM BOOT (BLACK BOOT)  You may remove boot periodically. Perform daily dressing changes as noted below.  Wash the liner of the boot regularly and wear a sock when wearing the boot. It is recommended that you sleep in the boot until told otherwise    Call office for the following: Temperature greater than 101F Persistent nausea and vomiting Severe uncontrolled pain Redness, tenderness, or signs of infection (pain, swelling, redness, odor or green/yellow discharge around the site) Difficulty breathing, headache or visual  disturbances Hives Persistent dizziness or light-headedness Extreme fatigue Any other questions or concerns you may have after discharge  In an emergency, call 911 or go to an Emergency Department at a nearby hospital  HELPFUL INFORMATION  If you had a block, it will wear off between 8-24 hrs postop typically.  This is period when your pain may go from nearly zero to the pain you would have had postop without the block.  This is an abrupt transition but nothing dangerous is happening.  You may take an extra dose of narcotic when this happens.  You should wean off your narcotic medicines as soon as you are able.  Most patients will be off or using minimal narcotics before their first postop appointment.   We suggest you use the pain medication the first night prior to going to bed, in order to ease any pain when the anesthesia wears off. You should avoid taking pain medications on an empty stomach as it will make you nauseous.  Do not drink alcoholic beverages or take illicit drugs when taking pain medications.  In most states it is against the law to drive while you are in a splint or sling.  And certainly against the law to drive while taking narcotics.  You may return to work/school in the next couple of days when you feel up to it.   Pain medication may make you constipated.  Below are a few solutions to try in this order: Decrease the amount of pain medication if you aren't having pain. Drink lots of decaffeinated fluids. Drink prune juice and/or each dried prunes  If the first 3 don't work start with additional solutions Take Colace - an over-the-counter stool softener Take Senokot - an over-the-counter laxative Take Miralax - a stronger over-the-counter laxative     CALL THE OFFICE WITH ANY QUESTIONS OR CONCERNS: [phone]   VISIT OUR WEBSITE FOR ADDITIONAL INFORMATION: [email or web address]

## 2023-05-13 NOTE — Brief Op Note (Signed)
05/13/2023  10:08 AM  PATIENT:  [name]  27 y.o. male  PRE-OPERATIVE DIAGNOSIS:  RETAINED EXTERNAL FIXATOR PELVIS  POST-OPERATIVE DIAGNOSIS:  RETAINED EXTERNAL FIXATOR PELVIS  PROCEDURE:  Procedure(s): 1. REMOVAL EXTERNAL FIXATION PELVIS UNDER ANESTHESIA 2. CURETTAGE OF PIN SITES INCLUDING BONE 3. STRESS EVALUATION UNDER FLUOROSCOPY PELVIC RING 4. DRESSING CHANGE LEFT TIBIA  SURGEON:  Surgeons and Role:    Myrene Galas, MD - Primary  PHYSICIAN ASSISTANT: NONE.  ANESTHESIA:   general  EBL:  15 mL   BLOOD ADMINISTERED:none  DRAINS: none   LOCAL MEDICATIONS USED:  NONE  SPECIMEN:  No Specimen  DISPOSITION OF SPECIMEN:  N/A  COUNTS:  YES  TOURNIQUET:  * Missing tourniquet times found for documented tourniquets in log: [id number] *  DICTATION: .Other Dictation: Dictation Number [id number]  PLAN OF CARE: Discharge to home after PACU  PATIENT DISPOSITION:  PACU - hemodynamically stable.   Delay start of Pharmacological VTE agent (>24hrs) due to surgical blood loss or risk of bleeding: no

## 2023-05-13 NOTE — Anesthesia Procedure Notes (Signed)
Procedure Name: LMA Insertion Date/Time: 05/13/2023 8:36 AM  Performed by: Waynard Edwards, CRNAPre-anesthesia Checklist: Patient identified, Emergency Drugs available, Suction available and Patient being monitored Patient Re-evaluated:Patient Re-evaluated prior to induction Oxygen Delivery Method: Circle system utilized Preoxygenation: Pre-oxygenation with 100% oxygen Induction Type: IV induction Ventilation: Mask ventilation without difficulty LMA: LMA inserted LMA Size: 4.0 Number of attempts: 1 Placement Confirmation: positive ETCO2 and breath sounds checked- equal and bilateral Dental Injury: Teeth and Oropharynx as per pre-operative assessment
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
## 2023-05-14 ENCOUNTER — Encounter: Payer: Self-pay | Admitting: Family Medicine

## 2023-05-14 ENCOUNTER — Encounter (HOSPITAL_COMMUNITY): Payer: Self-pay | Admitting: Orthopedic Surgery

## 2023-05-14 NOTE — Anesthesia Postprocedure Evaluation (Signed)
Anesthesia Post Note  Patient: [name]  Procedure(s) Performed: REMOVAL EXTERNAL FIXATION PELVIS, CURETTAGE OF PIN SITES     Patient location during evaluation: PACU Anesthesia Type: General Level of consciousness: sedated and patient cooperative Pain management: pain level controlled Vital Signs Assessment: post-procedure vital signs reviewed and stable Respiratory status: spontaneous breathing Cardiovascular status: stable Anesthetic complications: no   No notable events documented.  Last Vitals:  Vitals:   05/13/23 1000 05/13/23 1015  BP: 116/77 116/69  Pulse: 79 75  Resp: 20 14  Temp: 36.4 C   SpO2: 98% 100%    Last Pain:  Vitals:   05/13/23 0945  TempSrc:   PainSc: 0-No pain                 Lewie Loron

## 2023-05-15 ENCOUNTER — Ambulatory Visit (INDEPENDENT_AMBULATORY_CARE_PROVIDER_SITE_OTHER): Payer: Self-pay | Admitting: Plastic Surgery

## 2023-05-15 VITALS — HR 90 | Temp 97.3°F

## 2023-05-15 DIAGNOSIS — S31109S Unspecified open wound of abdominal wall, unspecified quadrant without penetration into peritoneal cavity, sequela: Secondary | ICD-10-CM

## 2023-05-15 DIAGNOSIS — Z9889 Other specified postprocedural states: Secondary | ICD-10-CM

## 2023-05-15 NOTE — Progress Notes (Signed)
Mr. Bregman returns today for evaluation after operative debridement and placement of myriad tissue matrix.  Patient is doing well with no complaints.  He did have to go to the emergency department for a disimpaction but is now having normal bowel movements.  His sisters who accompany him today say that he is doing well from a dressing standpoint.  The Sorbact was removed today and the wound is granulating very nicely it is healing very quickly.  I replaced the dressings with a an Adaptic and water-based gel bandage.  I discussed activity levels with the patient and his family.  He may remove the dressings if he needs to for physical therapy.  He may shower as tolerated.  He should have his dressings changed daily.  He will return to see me in 2 weeks sooner if necessary.
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
## 2023-05-17 ENCOUNTER — Ambulatory Visit: Payer: Self-pay | Attending: Orthopedic Surgery

## 2023-05-17 ENCOUNTER — Other Ambulatory Visit: Payer: Self-pay

## 2023-05-17 DIAGNOSIS — S329XXD Fracture of unspecified parts of lumbosacral spine and pelvis, subsequent encounter for fracture with routine healing: Secondary | ICD-10-CM | POA: Insufficient documentation

## 2023-05-17 DIAGNOSIS — T07XXXA Unspecified multiple injuries, initial encounter: Secondary | ICD-10-CM | POA: Insufficient documentation

## 2023-05-17 DIAGNOSIS — M6281 Muscle weakness (generalized): Secondary | ICD-10-CM | POA: Insufficient documentation

## 2023-05-17 DIAGNOSIS — R2681 Unsteadiness on feet: Secondary | ICD-10-CM | POA: Insufficient documentation
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
## 2023-05-19 ENCOUNTER — Encounter: Payer: Self-pay | Admitting: Student

## 2023-05-20 ENCOUNTER — Ambulatory Visit: Payer: Self-pay | Admitting: Physical Therapy

## 2023-05-21 ENCOUNTER — Telehealth: Payer: Self-pay

## 2023-05-21 ENCOUNTER — Encounter: Payer: Self-pay | Admitting: Physical Therapy

## 2023-05-21 ENCOUNTER — Ambulatory Visit: Payer: Self-pay | Admitting: Physical Therapy

## 2023-05-21 DIAGNOSIS — R2681 Unsteadiness on feet: Secondary | ICD-10-CM

## 2023-05-21 DIAGNOSIS — S329XXD Fracture of unspecified parts of lumbosacral spine and pelvis, subsequent encounter for fracture with routine healing: Secondary | ICD-10-CM

## 2023-05-21 DIAGNOSIS — T07XXXA Unspecified multiple injuries, initial encounter: Secondary | ICD-10-CM

## 2023-05-21 DIAGNOSIS — M6281 Muscle weakness (generalized): Secondary | ICD-10-CM
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
## 2023-05-21 NOTE — Telephone Encounter (Signed)
Received fax from [employer]: [employer] will be contacting the patient with pricing options. Their insurance plan does not cover the supplies ordered under their benefits (no insurance information listed). Thank you for your patients.   Pt is self-pay

## 2023-05-27 ENCOUNTER — Ambulatory Visit: Payer: Self-pay | Admitting: Physical Therapy

## 2023-05-27 ENCOUNTER — Encounter: Payer: Self-pay | Admitting: Family Medicine

## 2023-05-27 ENCOUNTER — Encounter: Payer: Self-pay | Admitting: Physical Therapy

## 2023-05-27 ENCOUNTER — Ambulatory Visit (INDEPENDENT_AMBULATORY_CARE_PROVIDER_SITE_OTHER): Payer: Self-pay | Admitting: Family Medicine

## 2023-05-27 VITALS — BP 112/58 | HR 114 | Temp 98.6°F | Ht 65.0 in | Wt 143.0 lb

## 2023-05-27 DIAGNOSIS — Z8639 Personal history of other endocrine, nutritional and metabolic disease: Secondary | ICD-10-CM | POA: Insufficient documentation

## 2023-05-27 DIAGNOSIS — Z7689 Persons encountering health services in other specified circumstances: Secondary | ICD-10-CM | POA: Insufficient documentation

## 2023-05-27 DIAGNOSIS — T07XXXA Unspecified multiple injuries, initial encounter: Secondary | ICD-10-CM

## 2023-05-27 DIAGNOSIS — Z13220 Encounter for screening for lipoid disorders: Secondary | ICD-10-CM

## 2023-05-27 DIAGNOSIS — R2681 Unsteadiness on feet: Secondary | ICD-10-CM

## 2023-05-27 DIAGNOSIS — M62838 Other muscle spasm: Secondary | ICD-10-CM

## 2023-05-27 DIAGNOSIS — S329XXD Fracture of unspecified parts of lumbosacral spine and pelvis, subsequent encounter for fracture with routine healing: Secondary | ICD-10-CM

## 2023-05-27 DIAGNOSIS — M6281 Muscle weakness (generalized): Secondary | ICD-10-CM
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
## 2023-05-27 NOTE — Patient Instructions (Signed)
It was great to meet you today and I'm excited to have you join the [facility] practice. I hope you had a positive experience today! If you feel so inclined, please feel free to recommend our practice to friends and family. Mila Merry, FNP-C
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
## 2023-05-27 NOTE — Assessment & Plan Note (Signed)
Today we reviewed your medical history and current concerns as well as routine health maintenance items. Non-fasting labs were drawn. He is being managed by several specialists and concerns include medication management for his muscle spasms and pain. HM items up to date.

## 2023-05-28 LAB — LDL CHOLESTEROL, DIRECT: Direct LDL: 101 mg/dL — ABNORMAL HIGH (ref <100)

## 2023-05-28 LAB — BASIC METABOLIC PANEL
BUN/Creatinine Ratio: 22 (calc) (ref 6–22)
BUN: 11 mg/dL (ref 7–25)
CO2: 25 mmol/L (ref 20–32)
Calcium: 9.7 mg/dL (ref 8.6–10.3)
Chloride: 102 mmol/L (ref 98–110)
Creat: 0.49 mg/dL — ABNORMAL LOW (ref 0.60–1.24)
Glucose, Bld: 95 mg/dL (ref 65–99)
Potassium: 4.2 mmol/L (ref 3.5–5.3)
Sodium: 140 mmol/L (ref 135–146)

## 2023-05-29 ENCOUNTER — Ambulatory Visit: Payer: Self-pay | Admitting: Physical Therapy
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
## 2023-05-30 ENCOUNTER — Ambulatory Visit (INDEPENDENT_AMBULATORY_CARE_PROVIDER_SITE_OTHER): Payer: Self-pay | Admitting: Physician Assistant

## 2023-05-30 VITALS — BP 110/74 | HR 72

## 2023-05-30 DIAGNOSIS — S31109S Unspecified open wound of abdominal wall, unspecified quadrant without penetration into peritoneal cavity, sequela: Secondary | ICD-10-CM

## 2023-05-30 DIAGNOSIS — S31109D Unspecified open wound of abdominal wall, unspecified quadrant without penetration into peritoneal cavity, subsequent encounter: Secondary | ICD-10-CM

## 2023-06-03 ENCOUNTER — Ambulatory Visit: Payer: Medicaid Other | Admitting: Physical Therapy

## 2023-06-03 HISTORY — PX: CYSTOURETHROSCOPY: SHX476

## 2023-06-11 ENCOUNTER — Ambulatory Visit: Payer: Medicaid Other | Admitting: Physical Therapy

## 2023-06-12 ENCOUNTER — Ambulatory Visit: Payer: Medicaid Other | Admitting: Physical Therapy

## 2023-06-16 ENCOUNTER — Encounter: Payer: Self-pay | Admitting: Physician Assistant

## 2023-06-17 ENCOUNTER — Ambulatory Visit (INDEPENDENT_AMBULATORY_CARE_PROVIDER_SITE_OTHER): Payer: Self-pay | Admitting: Physician Assistant

## 2023-06-17 VITALS — BP 111/71 | HR 98

## 2023-06-17 DIAGNOSIS — S31109S Unspecified open wound of abdominal wall, unspecified quadrant without penetration into peritoneal cavity, sequela: Secondary | ICD-10-CM
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
## 2023-06-26 ENCOUNTER — Telehealth: Payer: Self-pay

## 2023-06-26 NOTE — Telephone Encounter (Signed)
Faxed wound care supply request to [facility] on 06/17/2023 with confirmed receipt. Will scan into chart.

## 2023-06-27 ENCOUNTER — Telehealth: Payer: Self-pay

## 2023-06-27 MED ORDER — GABAPENTIN 400 MG PO CAPS: 400.0000 mg | ORAL_CAPSULE | Freq: Three times a day (TID) | ORAL | 0 refills | Status: DC

## 2023-06-27 NOTE — Telephone Encounter (Signed)
Refill Gabapentin 
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
## 2023-07-01 ENCOUNTER — Ambulatory Visit (INDEPENDENT_AMBULATORY_CARE_PROVIDER_SITE_OTHER): Payer: Self-pay | Admitting: Physician Assistant

## 2023-07-01 VITALS — BP 121/75 | HR 79

## 2023-07-01 DIAGNOSIS — S31109D Unspecified open wound of abdominal wall, unspecified quadrant without penetration into peritoneal cavity, subsequent encounter: Secondary | ICD-10-CM

## 2023-07-01 DIAGNOSIS — S31109S Unspecified open wound of abdominal wall, unspecified quadrant without penetration into peritoneal cavity, sequela: Secondary | ICD-10-CM

## 2023-07-08 DIAGNOSIS — N322 Vesical fistula, not elsewhere classified: Secondary | ICD-10-CM | POA: Insufficient documentation

## 2023-07-09 HISTORY — PX: EXPLORATORY LAPAROTOMY: SUR591

## 2023-07-16 ENCOUNTER — Other Ambulatory Visit (HOSPITAL_COMMUNITY): Payer: Self-pay

## 2023-07-23 ENCOUNTER — Ambulatory Visit: Payer: Medicaid Other | Admitting: Physician Assistant

## 2023-07-23 ENCOUNTER — Encounter: Payer: Self-pay | Admitting: Physician Assistant

## 2023-07-23 VITALS — BP 124/79 | HR 76

## 2023-07-23 DIAGNOSIS — S31109D Unspecified open wound of abdominal wall, unspecified quadrant without penetration into peritoneal cavity, subsequent encounter: Secondary | ICD-10-CM | POA: Diagnosis not present

## 2023-07-23 DIAGNOSIS — S31109S Unspecified open wound of abdominal wall, unspecified quadrant without penetration into peritoneal cavity, sequela: Secondary | ICD-10-CM
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
## 2023-07-29 ENCOUNTER — Other Ambulatory Visit: Payer: Self-pay

## 2023-07-29 ENCOUNTER — Ambulatory Visit: Payer: Medicaid Other | Attending: Orthopedic Surgery | Admitting: Physical Therapy

## 2023-07-29 ENCOUNTER — Encounter: Payer: Self-pay | Admitting: Physical Therapy

## 2023-07-29 DIAGNOSIS — M6281 Muscle weakness (generalized): Secondary | ICD-10-CM | POA: Insufficient documentation

## 2023-07-29 DIAGNOSIS — M25672 Stiffness of left ankle, not elsewhere classified: Secondary | ICD-10-CM | POA: Diagnosis present

## 2023-07-29 DIAGNOSIS — M25621 Stiffness of right elbow, not elsewhere classified: Secondary | ICD-10-CM | POA: Insufficient documentation

## 2023-07-29 DIAGNOSIS — M5459 Other low back pain: Secondary | ICD-10-CM | POA: Insufficient documentation

## 2023-07-29 DIAGNOSIS — R2689 Other abnormalities of gait and mobility: Secondary | ICD-10-CM | POA: Insufficient documentation
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
## 2023-07-29 NOTE — Patient Instructions (Signed)
Access Code: [email or web address] URL: [email or web address] Date: 07/29/2023 Prepared by: Rosana Hoes  Exercises - Active Straight Leg Raise with Quad Set  - 1 x daily - 2 sets - 10 reps - Hooklying Clamshell with Resistance  - 1 x daily - 2 sets - 10 reps - Supine Hip Adduction Isometric with Ball  - 1 x daily - 2 sets - 10 reps - 5 seconds hold - Bridge  - 1 x daily - 2 sets - 10 reps - 5 seconds hold - Hip Abduction with Resistance Loop  - 1 x daily - 2 sets - 10 reps - Hip Extension with Resistance Loop  - 1 x daily - 2 sets - 10 reps - Standing Heel Raise  - 1 x daily - 2 sets - 20 reps - Gastroc Stretch on Wall  - 1-2 x daily - 3 reps - 30 seconds hold

## 2023-07-31 DIAGNOSIS — N35014 Post-traumatic urethral stricture, male, unspecified: Secondary | ICD-10-CM | POA: Insufficient documentation

## 2023-08-01 ENCOUNTER — Encounter: Payer: Self-pay | Admitting: Physical Therapy

## 2023-08-05 ENCOUNTER — Ambulatory Visit: Payer: Medicaid Other | Attending: Orthopedic Surgery | Admitting: Physical Therapy

## 2023-08-05 DIAGNOSIS — R2689 Other abnormalities of gait and mobility: Secondary | ICD-10-CM | POA: Diagnosis present

## 2023-08-05 DIAGNOSIS — M5459 Other low back pain: Secondary | ICD-10-CM | POA: Insufficient documentation

## 2023-08-05 DIAGNOSIS — M25621 Stiffness of right elbow, not elsewhere classified: Secondary | ICD-10-CM | POA: Insufficient documentation

## 2023-08-05 DIAGNOSIS — M6281 Muscle weakness (generalized): Secondary | ICD-10-CM | POA: Insufficient documentation

## 2023-08-05 DIAGNOSIS — M25672 Stiffness of left ankle, not elsewhere classified: Secondary | ICD-10-CM | POA: Insufficient documentation
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
## 2023-08-07 ENCOUNTER — Ambulatory Visit: Payer: Medicaid Other

## 2023-08-07 DIAGNOSIS — R2689 Other abnormalities of gait and mobility: Secondary | ICD-10-CM | POA: Diagnosis not present

## 2023-08-07 DIAGNOSIS — M5459 Other low back pain: Secondary | ICD-10-CM

## 2023-08-07 DIAGNOSIS — M6281 Muscle weakness (generalized): Secondary | ICD-10-CM

## 2023-08-12 ENCOUNTER — Ambulatory Visit: Payer: Medicaid Other | Admitting: Physical Therapy

## 2023-08-12 ENCOUNTER — Encounter: Payer: Self-pay | Admitting: Physical Therapy

## 2023-08-12 DIAGNOSIS — R2689 Other abnormalities of gait and mobility: Secondary | ICD-10-CM | POA: Diagnosis not present

## 2023-08-12 DIAGNOSIS — M5459 Other low back pain: Secondary | ICD-10-CM
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
## 2023-08-14 ENCOUNTER — Ambulatory Visit: Payer: Medicaid Other | Admitting: Physical Therapy
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
## 2023-08-15 ENCOUNTER — Encounter: Payer: Medicaid Other | Admitting: Physician Assistant

## 2023-08-18 ENCOUNTER — Encounter: Payer: Self-pay | Admitting: Family Medicine

## 2023-08-18 ENCOUNTER — Ambulatory Visit: Payer: Medicaid Other | Admitting: Family Medicine

## 2023-08-18 VITALS — BP 115/62 | HR 72 | Temp 98.6°F | Ht 65.0 in | Wt 149.0 lb

## 2023-08-18 DIAGNOSIS — M62838 Other muscle spasm: Secondary | ICD-10-CM | POA: Diagnosis not present

## 2023-08-18 NOTE — Progress Notes (Signed)
Subjective:  HPI: [name] is a 27 y.o. male presenting on 08/18/2023 for Follow-up (3 mos f/u med mgmnt)   HPI Patient is in today for follow-up for medication management. Since his initial visit to establish care with me [name] has made great improvements in his mobility. He continues to see PT but is no longer requiring a wheelchair. He is ambulating with 1 crutch. He does have an upcoming urethroplasty planned in January. He reports his pain and twitching are well controlled and he is only using Gabapentin and Baclofen PRN a few times a week. Will continue to wean. No further concerns.  Review of Systems  All other systems reviewed and are negative.   Relevant past medical history reviewed and updated as indicated.   Past Medical History:  Diagnosis Date   History of blood transfusion 04/01/2023   Pneumonia      Past Surgical History:  Procedure Laterality Date   BLADDER REPAIR  03/15/2023   Procedure: BLADDER REPAIR;  Surgeon: Despina Arias, MD;  Location: [facility] OR;  Service: Urology;;   CLOSED REDUCTION ELBOW FRACTURE Right 03/17/2023   Procedure: CLOSED REDUCTION ELBOW;  Surgeon: Myrene Galas, MD;  Location: [facility] OR;  Service: Orthopedics;  Laterality: Right;   CLOSED REDUCTION RADIAL SHAFT  03/15/2023   Procedure: CLOSED REDUCTION SPLINT APPLICATION RIGHT ARM;  Surgeon: Toni Arthurs, MD;  Location: [facility] OR;  Service: Orthopedics;;   CYSTOSCOPY WITH RETROGRADE URETHROGRAM  03/15/2023   Procedure: CYSTOSCOPY WITH RETROGRADE URETHROGRAM;  Surgeon: Despina Arias, MD;  Location: [facility] OR;  Service: Urology;;   DEBRIDEMENT AND CLOSURE WOUND N/A 04/02/2023   Procedure: EXAM UNDER ANESTHESIA, IRRIGATION AND DEBRIDEMENT, PARTIAL CLOSURE OF  PERINEAL REGION WOUND;  Surgeon: Santiago Glad, MD;  Location: [facility] OR;  Service: Plastics;  Laterality: N/A;   DEBRIDEMENT AND CLOSURE WOUND Left 05/06/2023   Procedure: Examination of perineal wounds under anesthesia, placement of myriad  and closure of perineal wounds as appropriate;  Surgeon: Santiago Glad, MD;  Location: [facility] OR;  Service: Plastics;  Laterality: Left;   ELBOW SURGERY     EXTERNAL FIXATION LEG Left 03/15/2023   Procedure: EXTERNAL FIXATION LEG;  Surgeon: Toni Arthurs, MD;  Location: [facility] OR;  Service: Orthopedics;  Laterality: Left;   EXTERNAL FIXATION PELVIS Left 03/17/2023   Procedure: EXTERNAL FIXATION PELVIS;  Surgeon: Myrene Galas, MD;  Location: [facility] OR;  Service: Orthopedics;  Laterality: Left;   EXTERNAL FIXATION PELVIS  03/15/2023   Procedure: EXTERNAL FIXATION PELVIS;  Surgeon: Toni Arthurs, MD;  Location: [facility] OR;  Service: Orthopedics;;   EXTERNAL FIXATION REMOVAL N/A 05/13/2023   Procedure: REMOVAL EXTERNAL FIXATION PELVIS, CURETTAGE OF PIN SITES;  Surgeon: Myrene Galas, MD;  [facility];  Service: Orthopedics;  Laterality: N/A;   HIP CLOSED REDUCTION Left 03/15/2023   Procedure: CLOSED REDUCTION HIP;  Surgeon: Toni Arthurs, MD;  [facility];  Service: Orthopedics;  Laterality: Left;   I & D EXTREMITY Bilateral 03/17/2023   Procedure: IRRIGATION AND DEBRIDEMENT BILATERAL LOWER EXTREMITIES;  Surgeon: Myrene Galas, MD;  [facility];  Service: Orthopedics;  Laterality: Bilateral;   I & D EXTREMITY  03/15/2023   Procedure: IRRIGATION AND DEBRIDEMENT EXTREMITY;  Surgeon: Fritzi Mandes, MD;  Location: [facility] OR;  Service: General;;   I & D EXTREMITY Left 03/15/2023   Procedure: IRRIGATION AND DEBRIDEMENT EXTREMITY;  Surgeon: Toni Arthurs, MD;  [facility];  Service: Orthopedics;  Laterality: Left;   INCISION AND DRAINAGE OF WOUND  N/A 03/17/2023   Procedure: IRRIGATION AND DEBRIDEMENT PERINEAL;  Surgeon: Sheliah Hatch, De Blanch, MD;  Location: [facility] OR;  Service: General;  Laterality: N/A;   INCISION AND DRAINAGE PERIRECTAL ABSCESS N/A 03/20/2023   Procedure: IRRIGATION AND DEBRIDEMENT PERINEUM WITH MYRIAD APPLICATION;  Surgeon: Diamantina Monks, MD;  [facility];  Service: General;  Laterality: N/A;    INSERTION OF SUPRAPUBIC CATHETER  03/15/2023   Procedure: CYSTOTOMY WITH INSERTION OF SUPRAPUBIC CATHETER;  Surgeon: Despina Arias, MD;  [facility];  Service: Urology;;   LACERATION REPAIR  03/17/2023   Procedure: REPAIR MULTIPLE LACERATIONS;  Surgeon: Myrene Galas, MD;  Location: [facility] OR;  Service: Orthopedics;;   LACERATION REPAIR  03/15/2023   Procedure: LACERATION REPAIR;  Surgeon: Toni Arthurs, MD;  Location: [facility] OR;  Service: Orthopedics;;   LACERATION REPAIR  03/15/2023   Procedure: URETHRAL REPAIR OF LACERATION;  Surgeon: Despina Arias, MD;  Location: [facility] OR;  Service: Urology;;   ORIF ELBOW FRACTURE Right 03/20/2023   Procedure: OPEN REDUCTION INTERNAL FIXATION (ORIF) RIGHT ELBOW FRACTURE;  Surgeon: Myrene Galas, MD;  [facility];  Service: Orthopedics;  Laterality: Right;   TIBIA IM NAIL INSERTION Left 03/20/2023   Procedure: INTRAMEDULLARY (IM) NAIL TIBIAL;  Surgeon: Myrene Galas, MD;  [facility]: [facility] OR;  Service: Orthopedics;  Laterality: Left;    Allergies and medications reviewed and updated.   Current Outpatient Medications:    acetaminophen (TYLENOL) 500 MG tablet, Take 500-1,000 mg by mouth every 6 (six) hours as needed for moderate pain., Disp: , Rfl:    Baclofen 5 MG TABS, Take 1 tablet (5 mg total) by mouth 3 (three) times daily., Disp: 90 tablet, Rfl: 1   gabapentin (NEURONTIN) 400 MG capsule, Take 1 capsule (400 mg total) by mouth 3 (three) times daily., Disp: 90 capsule, Rfl: 0   oxybutynin (DITROPAN-XL) 5 MG 24 hr tablet, Take 5 mg by mouth at bedtime., Disp: , Rfl:   Allergies  Allergen Reactions   Walnut Swelling    Objective:   BP 115/62   Pulse 72   Temp 98.6 F (37 C) (Oral)   Ht 5\' 5"  (1.651 m)   Wt 149 lb (67.6 kg)   SpO2 97%   BMI 24.79 kg/m      08/18/2023    2:36 PM 07/23/2023    1:39 PM 07/01/2023    2:35 PM  Vitals with BMI  Height 5\' 5"     Weight 149 lbs    BMI 24.79    Systolic 115 124 161  Diastolic 62 79 75  Pulse 72  76 79     Physical Exam Vitals and nursing note reviewed.  Constitutional:      Appearance: Normal appearance. He is normal weight.  HENT:     Head: Normocephalic and atraumatic.  Skin:    General: Skin is warm and dry.     Capillary Refill: Capillary refill takes less than 2 seconds.  Neurological:     General: No focal deficit present.     Mental Status: He is alert and oriented to person, place, and time. Mental status is at baseline.  Psychiatric:        Mood and Affect: Mood normal.        Behavior: Behavior normal.        Thought Content: Thought content normal.        Judgment: Judgment normal.     Assessment & Plan:  Muscle spasm Assessment & Plan: Continue to wean Gabapentin  and Baclofen. Discussed that no refills are needed today and if his pain or "twitching" becomes uncontrolled he will return to office. Ongoing PT. Follow up for annual physical with labs.      Follow up plan: Return in about 9 months (around 05/17/2024) for annual physical with labs 1 week prior.  Park Meo, FNP

## 2023-08-18 NOTE — Assessment & Plan Note (Signed)
Continue to wean Gabapentin and Baclofen. Discussed that no refills are needed today and if his pain or "twitching" becomes uncontrolled he will return to office. Ongoing PT. Follow up for annual physical with labs.

## 2023-08-19 ENCOUNTER — Ambulatory Visit: Payer: Medicaid Other | Admitting: Physical Therapy

## 2023-08-19 ENCOUNTER — Encounter: Payer: Self-pay | Admitting: Physical Therapy

## 2023-08-19 DIAGNOSIS — M6281 Muscle weakness (generalized): Secondary | ICD-10-CM

## 2023-08-19 DIAGNOSIS — R2689 Other abnormalities of gait and mobility: Secondary | ICD-10-CM
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
## 2023-08-21 ENCOUNTER — Ambulatory Visit: Payer: Medicaid Other | Admitting: Physical Therapy

## 2023-08-21 ENCOUNTER — Encounter: Payer: Self-pay | Admitting: Physical Therapy

## 2023-08-21 DIAGNOSIS — M5459 Other low back pain: Secondary | ICD-10-CM

## 2023-08-21 DIAGNOSIS — R2689 Other abnormalities of gait and mobility: Secondary | ICD-10-CM

## 2023-08-21 DIAGNOSIS — M6281 Muscle weakness (generalized): Secondary | ICD-10-CM
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
## 2023-08-22 ENCOUNTER — Encounter: Payer: Medicaid Other | Admitting: Physician Assistant

## 2023-08-31 HISTORY — PX: OTHER SURGICAL HISTORY: SHX169

## 2023-08-31 HISTORY — PX: SCROTOPLASTY: SHX489

## 2023-09-04 ENCOUNTER — Ambulatory Visit: Payer: Medicaid Other | Admitting: Physical Therapy

## 2023-09-09 ENCOUNTER — Encounter: Payer: Medicaid Other | Admitting: Physical Therapy

## 2023-09-11 ENCOUNTER — Other Ambulatory Visit: Payer: Self-pay | Admitting: Physical Medicine and Rehabilitation

## 2023-09-11 ENCOUNTER — Ambulatory Visit: Payer: Medicaid Other | Admitting: Physical Therapy

## 2023-09-16 ENCOUNTER — Ambulatory Visit: Payer: Medicaid Other

## 2023-09-18 ENCOUNTER — Encounter: Payer: Medicaid Other | Admitting: Physical Therapy

## 2023-09-18 HISTORY — PX: OTHER SURGICAL HISTORY: SHX169

## 2023-09-23 ENCOUNTER — Encounter: Payer: Medicaid Other | Admitting: Physical Therapy

## 2024-05-14 ENCOUNTER — Other Ambulatory Visit: Payer: Medicaid Other

## 2024-05-17 ENCOUNTER — Ambulatory Visit (INDEPENDENT_AMBULATORY_CARE_PROVIDER_SITE_OTHER): Payer: Medicaid Other | Admitting: Family Medicine

## 2024-05-17 ENCOUNTER — Encounter: Payer: Self-pay | Admitting: Family Medicine

## 2024-05-17 VITALS — BP 132/74 | HR 90 | Temp 98.4°F | Ht 65.0 in | Wt 171.4 lb

## 2024-05-17 DIAGNOSIS — Z0001 Encounter for general adult medical examination with abnormal findings: Secondary | ICD-10-CM

## 2024-05-17 DIAGNOSIS — L568 Other specified acute skin changes due to ultraviolet radiation: Secondary | ICD-10-CM

## 2024-05-17 DIAGNOSIS — E663 Overweight: Secondary | ICD-10-CM | POA: Diagnosis not present

## 2024-05-17 DIAGNOSIS — Z23 Encounter for immunization: Secondary | ICD-10-CM | POA: Diagnosis not present

## 2024-05-17 DIAGNOSIS — Z0000 Encounter for general adult medical examination without abnormal findings: Secondary | ICD-10-CM | POA: Insufficient documentation

## 2024-05-17 NOTE — Addendum Note (Signed)
 Addended by: CORINNA JESUSA SAUNDERS on: 05/17/2024 04:15 PM   Modules accepted: Orders

## 2024-05-17 NOTE — Assessment & Plan Note (Signed)
 Work on resuming a heart healthy diet and resuming exercise and physical activity

## 2024-05-17 NOTE — Assessment & Plan Note (Signed)
 Reports sensitivity to light during day and when driving at night. No eye pain, blurred vision, or double vision. Referral to ophthalmology.
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
## 2024-05-17 NOTE — Assessment & Plan Note (Signed)

## 2024-05-18 ENCOUNTER — Ambulatory Visit: Payer: Self-pay | Admitting: Family Medicine

## 2024-05-18 LAB — CBC WITH DIFFERENTIAL/PLATELET
Absolute Lymphocytes: 2096 {cells}/uL (ref 850–3900)
Absolute Monocytes: 490 {cells}/uL (ref 200–950)
Basophils Absolute: 31 {cells}/uL (ref 0–200)
Basophils Relative: 0.5 %
Eosinophils Absolute: 112 {cells}/uL (ref 15–500)
Eosinophils Relative: 1.8 %
HCT: 45.9 % (ref 38.5–50.0)
Hemoglobin: 14.8 g/dL (ref 13.2–17.1)
MCH: 28.2 pg (ref 27.0–33.0)
MCHC: 32.2 g/dL (ref 32.0–36.0)
MCV: 87.4 fL (ref 80.0–100.0)
MPV: 9.5 fL (ref 7.5–12.5)
Monocytes Relative: 7.9 %
Neutro Abs: 3472 {cells}/uL (ref 1500–7800)
Neutrophils Relative %: 56 %
Platelets: 374 Thousand/uL (ref 140–400)
RBC: 5.25 Million/uL (ref 4.20–5.80)
RDW: 13 % (ref 11.0–15.0)
Total Lymphocyte: 33.8 %
WBC: 6.2 Thousand/uL (ref 3.8–10.8)

## 2024-05-18 LAB — COMPREHENSIVE METABOLIC PANEL WITH GFR
AG Ratio: 1.6 (calc) (ref 1.0–2.5)
ALT: 21 U/L (ref 9–46)
AST: 16 U/L (ref 10–40)
Albumin: 4.5 g/dL (ref 3.6–5.1)
Alkaline phosphatase (APISO): 100 U/L (ref 36–130)
BUN: 9 mg/dL (ref 7–25)
CO2: 26 mmol/L (ref 20–32)
Calcium: 9.3 mg/dL (ref 8.6–10.3)
Chloride: 103 mmol/L (ref 98–110)
Creat: 0.75 mg/dL (ref 0.60–1.24)
Globulin: 2.9 g/dL (ref 1.9–3.7)
Glucose, Bld: 105 mg/dL — ABNORMAL HIGH (ref 65–99)
Potassium: 4.1 mmol/L (ref 3.5–5.3)
Sodium: 138 mmol/L (ref 135–146)
Total Bilirubin: 0.4 mg/dL (ref 0.2–1.2)
Total Protein: 7.4 g/dL (ref 6.1–8.1)
eGFR: 125 mL/min/1.73m2 (ref >=60)

## 2024-05-18 LAB — LIPID PANEL
Cholesterol: 150 mg/dL (ref <200)
HDL: 30 mg/dL — ABNORMAL LOW (ref >=40)
LDL Cholesterol (Calc): 80 mg/dL
Non-HDL Cholesterol (Calc): 120 mg/dL (ref <130)
Total CHOL/HDL Ratio: 5 (calc) — ABNORMAL HIGH (ref <5.0)
Triglycerides: 328 mg/dL — ABNORMAL HIGH (ref <150)

## 2024-05-18 LAB — HEPATITIS C ANTIBODY: Hepatitis C Ab: NONREACTIVE

## 2024-05-18 LAB — HEMOGLOBIN A1C
Hgb A1c MFr Bld: 5.6 % (ref <5.7)
Mean Plasma Glucose: 114 mg/dL
eAG (mmol/L): 6.3 mmol/L

## 2024-06-17 ENCOUNTER — Ambulatory Visit

## 2024-07-26 ENCOUNTER — Encounter: Payer: Self-pay | Admitting: Orthopedic Surgery

## 2024-08-24 ENCOUNTER — Encounter (HOSPITAL_COMMUNITY): Payer: Self-pay | Admitting: Orthopedic Surgery

## 2024-08-25 ENCOUNTER — Encounter (HOSPITAL_COMMUNITY): Payer: Self-pay | Admitting: Orthopedic Surgery

## 2024-08-25 ENCOUNTER — Other Ambulatory Visit: Payer: Self-pay

## 2024-08-25 NOTE — Progress Notes (Signed)
 PCP - Jeoffrey Barrio, FNP Cardiologist - none  CT Chest x-ray - 03/25/23 EKG - n/a Stress Test - n/a ECHO - n/a Cardiac Cath - n/a  ICD Pacemaker/Loop - n/a  Sleep Study -  n/a  Diabetes - n/a  Aspirin & Blood Thinner Instructions:  n/a  ERAS - clear liquids til 5 AM DOS.  Anesthesia review: no  STOP now taking any Aspirin (unless otherwise instructed by your surgeon), Aleve, Naproxen, Ibuprofen, Motrin, Advil, Goody's, BC's, all herbal medications, fish oil, and all vitamins.   Coronavirus Screening Do you have any of the following symptoms:  Cough yes/no: No Fever (>100.10F)  yes/no: No Runny nose yes/no: No Sore throat yes/no: No Difficulty breathing/shortness of breath  yes/no: No  Have you traveled in the last 14 days and where? [place]  Patient verbalized understanding of instructions that were given via phone.

## 2024-08-30 ENCOUNTER — Other Ambulatory Visit: Payer: Self-pay | Admitting: Orthopedic Surgery

## 2024-08-30 ENCOUNTER — Ambulatory Visit
Admission: RE | Admit: 2024-08-30 | Discharge: 2024-08-30 | Disposition: A | Source: Ambulatory Visit | Attending: Orthopedic Surgery | Admitting: Orthopedic Surgery

## 2024-08-30 DIAGNOSIS — M79605 Pain in left leg: Secondary | ICD-10-CM
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
## 2024-08-31 ENCOUNTER — Encounter (HOSPITAL_COMMUNITY): Payer: Self-pay | Admitting: Orthopedic Surgery

## 2024-08-31 ENCOUNTER — Other Ambulatory Visit: Payer: Self-pay

## 2024-08-31 NOTE — Progress Notes (Signed)
 SDW CALL  Patient was given pre-op instructions over the phone. The opportunity was given for the patient to ask questions. No further questions asked. Patient verbalized understanding of instructions given.   PCP - Kayla Jeoffrey RAMAN, FNP  Cardiologist -   PPM/ICD - denies Device Orders - n/a Rep Notified - n/a  Chest x-ray - Chest Ct 03-25-23 EKG - denies Stress Test - denies ECHO - denies Cardiac Cath - denies  Sleep Study - denies CPAP - n/a  Dm -denies  Blood Thinner Instructions: denies Aspirin Instructions:denies  ERAS Protcol -NPO   COVID TEST- n/a  Anesthesia review: no   -------------  SDW INSTRUCTIONS given:  Your procedure is scheduled on September 02, 2024.  Report to [facility] Main Entrance A at 7:30 A.M., and check in at the Admitting office.  Call this number if you have problems the morning of surgery:  [phone]   Remember:  Do not eat  or drink after midnight the night before your surgery     Take these medicines the morning of surgery with A SIP OF WATER   acetaminophen  (TYLENOL )   As of today, STOP taking any Aspirin (unless otherwise instructed by your surgeon) Aleve, Naproxen, Ibuprofen, Motrin, Advil, Goody's, BC's, all herbal medications, fish oil, and all vitamins.                 All instructions explained to the patient, with a verbal understanding of the material. Patient agrees to go over the instructions while at home for a better understanding.

## 2024-09-02 ENCOUNTER — Other Ambulatory Visit: Payer: Self-pay

## 2024-09-02 ENCOUNTER — Ambulatory Visit (HOSPITAL_COMMUNITY)
Admission: RE | Admit: 2024-09-02 | Discharge: 2024-09-02 | Disposition: A | Discharge status: Home / Self Care | Attending: Orthopedic Surgery | Admitting: Orthopedic Surgery

## 2024-09-02 ENCOUNTER — Ambulatory Visit (HOSPITAL_COMMUNITY)

## 2024-09-02 ENCOUNTER — Ambulatory Visit (HOSPITAL_COMMUNITY): Admitting: Anesthesiology

## 2024-09-02 ENCOUNTER — Encounter (HOSPITAL_COMMUNITY): Admission: RE | Disposition: A | Payer: Self-pay | Source: Home / Self Care | Attending: Orthopedic Surgery

## 2024-09-02 ENCOUNTER — Other Ambulatory Visit (HOSPITAL_COMMUNITY): Payer: Self-pay

## 2024-09-02 ENCOUNTER — Encounter (HOSPITAL_COMMUNITY): Payer: Self-pay | Admitting: Orthopedic Surgery

## 2024-09-02 DIAGNOSIS — S82202K Unspecified fracture of shaft of left tibia, subsequent encounter for closed fracture with nonunion: Secondary | ICD-10-CM | POA: Insufficient documentation

## 2024-09-02 DIAGNOSIS — T8484XA Pain due to internal orthopedic prosthetic devices, implants and grafts, initial encounter: Secondary | ICD-10-CM | POA: Diagnosis present

## 2024-09-02 DIAGNOSIS — Z01818 Encounter for other preprocedural examination: Secondary | ICD-10-CM

## 2024-09-02 DIAGNOSIS — Z472 Encounter for removal of internal fixation device: Secondary | ICD-10-CM | POA: Diagnosis not present

## 2024-09-02 HISTORY — PX: TIBIA IM NAIL INSERTION: SHX2516

## 2024-09-02 LAB — CBC WITH DIFFERENTIAL/PLATELET
Abs Immature Granulocytes: 0.06 K/uL (ref 0.00–0.07)
Basophils Absolute: 0.1 K/uL (ref 0.0–0.1)
Basophils Relative: 1 %
Eosinophils Absolute: 0.2 K/uL (ref 0.0–0.5)
Eosinophils Relative: 2 %
HCT: 45 % (ref 39.0–52.0)
Hemoglobin: 14.6 g/dL (ref 13.0–17.0)
Immature Granulocytes: 1 %
Lymphocytes Relative: 31 %
Lymphs Abs: 2.3 K/uL (ref 0.7–4.0)
MCH: 28.2 pg (ref 26.0–34.0)
MCHC: 32.4 g/dL (ref 30.0–36.0)
MCV: 87 fL (ref 80.0–100.0)
Monocytes Absolute: 0.5 K/uL (ref 0.1–1.0)
Monocytes Relative: 7 %
Neutro Abs: 4.4 K/uL (ref 1.7–7.7)
Neutrophils Relative %: 58 %
Platelets: 337 K/uL (ref 150–400)
RBC: 5.17 MIL/uL (ref 4.22–5.81)
RDW: 12.7 % (ref 11.5–15.5)
WBC: 7.5 K/uL (ref 4.0–10.5)
nRBC: 0 % (ref 0.0–0.2)

## 2024-09-02 LAB — COMPREHENSIVE METABOLIC PANEL WITH GFR
ALT: 24 U/L (ref 0–44)
AST: 24 U/L (ref 15–41)
Albumin: 3.8 g/dL (ref 3.5–5.0)
Alkaline Phosphatase: 82 U/L (ref 38–126)
Anion gap: 10 (ref 5–15)
BUN: 11 mg/dL (ref 6–20)
CO2: 21 mmol/L — ABNORMAL LOW (ref 22–32)
Calcium: 9 mg/dL (ref 8.9–10.3)
Chloride: 104 mmol/L (ref 98–111)
Creatinine, Ser: 0.62 mg/dL (ref 0.61–1.24)
GFR, Estimated: >60 mL/min (ref >60)
Glucose, Bld: 98 mg/dL (ref 70–99)
Potassium: 4.1 mmol/L (ref 3.5–5.1)
Sodium: 135 mmol/L (ref 135–145)
Total Bilirubin: 0.8 mg/dL (ref 0.0–1.2)
Total Protein: 7.1 g/dL (ref 6.5–8.1)

## 2024-09-02 LAB — VITAMIN D 25 HYDROXY (VIT D DEFICIENCY, FRACTURES): Vit D, 25-Hydroxy: 15.9 ng/mL — ABNORMAL LOW (ref 30–100)

## 2024-09-02 SURGERY — INSERTION, INTRAMEDULLARY ROD, TIBIA
Anesthesia: General | Laterality: Left

## 2024-09-02 MED ORDER — MIDAZOLAM HCL (PF) 2 MG/2ML IJ SOLN
INTRAMUSCULAR | Status: DC | PRN
  Administered 2024-09-02 (×2): 1 mg via INTRAVENOUS

## 2024-09-02 MED ORDER — CHLORHEXIDINE GLUCONATE 0.12 % MT SOLN: 15.0000 mL | Freq: Once | OROMUCOSAL | Status: AC

## 2024-09-02 MED ORDER — AMISULPRIDE (ANTIEMETIC) 5 MG/2ML IV SOLN: 10.0000 mg | Freq: Once | INTRAVENOUS | Status: DC | PRN

## 2024-09-02 MED ORDER — VANCOMYCIN HCL 1000 MG IV SOLR
INTRAVENOUS | Status: AC
  Filled 2024-09-02: qty 20

## 2024-09-02 MED ORDER — OXYCODONE-ACETAMINOPHEN 7.5-325 MG PO TABS
1.0000 | ORAL_TABLET | Freq: Three times a day (TID) | ORAL | 0 refills | Status: AC | PRN
  Filled 2024-09-02: qty 40, 5d supply, fill #0

## 2024-09-02 MED ORDER — TRANEXAMIC ACID-NACL 1000-0.7 MG/100ML-% IV SOLN
1000.0000 mg | INTRAVENOUS | Status: AC
  Administered 2024-09-02: 1000 mg via INTRAVENOUS

## 2024-09-02 MED ORDER — KETOROLAC TROMETHAMINE 10 MG PO TABS
10.0000 mg | ORAL_TABLET | Freq: Four times a day (QID) | ORAL | 0 refills | Status: AC | PRN
  Filled 2024-09-02: qty 20, 5d supply, fill #0

## 2024-09-02 MED ORDER — OXYCODONE HCL 5 MG/5ML PO SOLN: 5.0000 mg | Freq: Once | ORAL | Status: AC | PRN

## 2024-09-02 MED ORDER — FENTANYL CITRATE (PF) 100 MCG/2ML IJ SOLN
25.0000 ug | INTRAMUSCULAR | Status: DC | PRN
  Administered 2024-09-02 (×2): 25 ug via INTRAVENOUS
  Administered 2024-09-02: 50 ug via INTRAVENOUS

## 2024-09-02 MED ORDER — SUGAMMADEX SODIUM 200 MG/2ML IV SOLN
INTRAVENOUS | Status: DC | PRN
  Administered 2024-09-02: 150 mg via INTRAVENOUS

## 2024-09-02 MED ORDER — TRANEXAMIC ACID-NACL 1000-0.7 MG/100ML-% IV SOLN
INTRAVENOUS | Status: AC
  Filled 2024-09-02: qty 100

## 2024-09-02 MED ORDER — FENTANYL CITRATE (PF) 250 MCG/5ML IJ SOLN
INTRAMUSCULAR | Status: AC
  Filled 2024-09-02: qty 5

## 2024-09-02 MED ORDER — CEFAZOLIN SODIUM-DEXTROSE 2-4 GM/100ML-% IV SOLN
INTRAVENOUS | Status: AC
  Filled 2024-09-02: qty 100

## 2024-09-02 MED ORDER — ONDANSETRON 4 MG PO TBDP
4.0000 mg | ORAL_TABLET | Freq: Three times a day (TID) | ORAL | 0 refills | Status: AC | PRN
  Filled 2024-09-02: qty 20, 7d supply, fill #0

## 2024-09-02 MED ORDER — 0.9 % SODIUM CHLORIDE (POUR BTL) OPTIME
TOPICAL | Status: DC | PRN
  Administered 2024-09-02: 1000 mL

## 2024-09-02 MED ORDER — LACTATED RINGERS IV SOLN: INTRAVENOUS | Status: DC

## 2024-09-02 MED ORDER — VANCOMYCIN HCL 1000 MG IV SOLR
INTRAVENOUS | Status: DC | PRN
  Administered 2024-09-02: 1000 mg

## 2024-09-02 MED ORDER — MIDAZOLAM HCL 2 MG/2ML IJ SOLN
INTRAMUSCULAR | Status: AC
  Filled 2024-09-02: qty 2

## 2024-09-02 MED ORDER — METHOCARBAMOL 500 MG PO TABS
500.0000 mg | ORAL_TABLET | Freq: Four times a day (QID) | ORAL | 0 refills | Status: AC | PRN
  Filled 2024-09-02: qty 60, 8d supply, fill #0

## 2024-09-02 MED ORDER — ACETAMINOPHEN 500 MG PO TABS
1000.0000 mg | ORAL_TABLET | Freq: Once | ORAL | Status: AC
  Administered 2024-09-02: 1000 mg via ORAL
  Filled 2024-09-02: qty 2

## 2024-09-02 MED ORDER — CHLORHEXIDINE GLUCONATE 0.12 % MT SOLN
OROMUCOSAL | Status: AC
  Administered 2024-09-02: 15 mL via OROMUCOSAL
  Filled 2024-09-02: qty 15

## 2024-09-02 MED ORDER — FENTANYL CITRATE (PF) 250 MCG/5ML IJ SOLN
INTRAMUSCULAR | Status: DC | PRN
  Administered 2024-09-02 (×2): 50 ug via INTRAVENOUS
  Administered 2024-09-02: 150 ug via INTRAVENOUS

## 2024-09-02 MED ORDER — LIDOCAINE 2% (20 MG/ML) 5 ML SYRINGE
INTRAMUSCULAR | Status: DC | PRN
  Administered 2024-09-02: 60 mg via INTRAVENOUS

## 2024-09-02 MED ORDER — ORAL CARE MOUTH RINSE: 15.0000 mL | Freq: Once | OROMUCOSAL | Status: AC

## 2024-09-02 MED ORDER — ROCURONIUM BROMIDE 10 MG/ML (PF) SYRINGE
PREFILLED_SYRINGE | INTRAVENOUS | Status: DC | PRN
  Administered 2024-09-02 (×2): 10 mg via INTRAVENOUS
  Administered 2024-09-02: 60 mg via INTRAVENOUS

## 2024-09-02 MED ORDER — CEFAZOLIN SODIUM-DEXTROSE 2-4 GM/100ML-% IV SOLN
2.0000 g | INTRAVENOUS | Status: AC
  Administered 2024-09-02: 2 g via INTRAVENOUS

## 2024-09-02 MED ORDER — PROPOFOL 10 MG/ML IV BOLUS
INTRAVENOUS | Status: DC | PRN
  Administered 2024-09-02: 200 mg via INTRAVENOUS

## 2024-09-02 MED ORDER — ONDANSETRON HCL 4 MG/2ML IJ SOLN
INTRAMUSCULAR | Status: DC | PRN
  Administered 2024-09-02: 4 mg via INTRAVENOUS

## 2024-09-02 MED ORDER — DEXAMETHASONE SOD PHOSPHATE PF 10 MG/ML IJ SOLN
INTRAMUSCULAR | Status: DC | PRN
  Administered 2024-09-02: 10 mg via INTRAVENOUS

## 2024-09-02 MED ORDER — FENTANYL CITRATE (PF) 100 MCG/2ML IJ SOLN
INTRAMUSCULAR | Status: AC
  Filled 2024-09-02: qty 2

## 2024-09-02 MED ORDER — OXYCODONE HCL 5 MG PO TABS
ORAL_TABLET | ORAL | Status: AC
  Filled 2024-09-02: qty 1

## 2024-09-02 MED ORDER — OXYCODONE HCL 5 MG PO TABS
5.0000 mg | ORAL_TABLET | Freq: Once | ORAL | Status: AC | PRN
  Administered 2024-09-02: 5 mg via ORAL

## 2024-09-02 SURGICAL SUPPLY — 49 items
BAG COUNTER SPONGE SURGICOUNT (BAG) ×1 IMPLANT
BLADE SURG 10 STRL SS (BLADE) ×1 IMPLANT
BNDG ELASTIC 4X5.8 VLCR STR LF (GAUZE/BANDAGES/DRESSINGS) ×1 IMPLANT
BNDG ELASTIC 6INX 5YD STR LF (GAUZE/BANDAGES/DRESSINGS) ×1 IMPLANT
BNDG GAUZE DERMACEA FLUFF 4 (GAUZE/BANDAGES/DRESSINGS) IMPLANT
BRUSH SCRUB EZ 4% CHG (MISCELLANEOUS) ×1 IMPLANT
BRUSH SCRUB EZ PLAIN DRY (MISCELLANEOUS) ×2 IMPLANT
COVER BACK TABLE 60X90IN (DRAPES) IMPLANT
COVER SURGICAL LIGHT HANDLE (MISCELLANEOUS) ×2 IMPLANT
DRAPE C-ARM 42X72 X-RAY (DRAPES) ×1 IMPLANT
DRAPE C-ARMOR (DRAPES) ×1 IMPLANT
DRAPE HALF SHEET 40X57 (DRAPES) IMPLANT
DRAPE INCISE IOBAN 66X45 STRL (DRAPES) IMPLANT
DRAPE U-SHAPE 47X51 STRL (DRAPES) ×1 IMPLANT
DRSG ADAPTIC 3X8 NADH LF (GAUZE/BANDAGES/DRESSINGS) ×1 IMPLANT
DRSG MEPITEL 4X7.2 (GAUZE/BANDAGES/DRESSINGS) ×1 IMPLANT
ELECTRODE REM PT RTRN 9FT ADLT (ELECTROSURGICAL) ×1 IMPLANT
GAUZE PAD ABD 8X10 STRL (GAUZE/BANDAGES/DRESSINGS) ×2 IMPLANT
GAUZE SPONGE 4X4 12PLY STRL (GAUZE/BANDAGES/DRESSINGS) ×1 IMPLANT
GLOVE BIO SURGEON STRL SZ8 (GLOVE) ×2 IMPLANT
GLOVE BIO SURGEON STRL SZ8.5 (GLOVE) ×1 IMPLANT
GLOVE BIOGEL PI IND STRL 7.5 (GLOVE) ×1 IMPLANT
GLOVE BIOGEL PI IND STRL 8 (GLOVE) ×1 IMPLANT
GLOVE SURG ORTHO LTX SZ7.5 (GLOVE) ×2 IMPLANT
GOWN STRL REUS W/ TWL LRG LVL3 (GOWN DISPOSABLE) ×2 IMPLANT
GOWN STRL REUS W/ TWL XL LVL3 (GOWN DISPOSABLE) ×1 IMPLANT
GUIDEWIRE 1.6X220 TOCAR TIP (WIRE) IMPLANT
GUIDEWIRE 3.2X400 (WIRE) IMPLANT
INSTRUMENT EXTRCTR SCRW F/NAIL (INSTRUMENTS) IMPLANT
KIT BASIN OR (CUSTOM PROCEDURE TRAY) ×1 IMPLANT
KIT TURNOVER KIT B (KITS) ×1 IMPLANT
NAIL TIB TFNA 9X345 (Nail) IMPLANT
PACK ORTHO EXTREMITY (CUSTOM PROCEDURE TRAY) ×1 IMPLANT
PAD ARMBOARD POSITIONER FOAM (MISCELLANEOUS) ×2 IMPLANT
PAD CAST 4YDX4 CTTN HI CHSV (CAST SUPPLIES) ×1 IMPLANT
PADDING CAST COTTON 6X4 STRL (CAST SUPPLIES) ×1 IMPLANT
REAMER ROD DEEP FLUTE 2.5X950 (INSTRUMENTS) IMPLANT
SPONGE T-LAP 18X18 ~~LOC~~+RFID (SPONGE) IMPLANT
STAPLER SKIN PROX 35W (STAPLE) ×1 IMPLANT
SUT ETHILON 2 0 FS 18 (SUTURE) ×2 IMPLANT
SUT ETHILON 2 0 PSLX (SUTURE) IMPLANT
SUT ETHILON 3 0 PS 1 (SUTURE) IMPLANT
SUT PDS AB 0 CT1 27 (SUTURE) IMPLANT
SUT PDS AB 2-0 CT1 27 (SUTURE) IMPLANT
SUT VIC AB 0 CT1 27XBRD ANBCTR (SUTURE) IMPLANT
SUT VIC AB 2-0 CT1 TAPERPNT 27 (SUTURE) ×1 IMPLANT
TOWEL GREEN STERILE (TOWEL DISPOSABLE) ×2 IMPLANT
TOWEL GREEN STERILE FF (TOWEL DISPOSABLE) ×1 IMPLANT
YANKAUER SUCT BULB TIP NO VENT (SUCTIONS) IMPLANT

## 2024-09-02 NOTE — Transfer of Care (Signed)
 Immediate Anesthesia Transfer of Care Note  Patient: [name].  Procedure(s) Performed: INSERTION, INTRAMEDULLARY ROD, TIBIA (Left)  Patient Location: PACU  Anesthesia Type:General  Level of Consciousness: awake, alert , and oriented  Airway & Oxygen Therapy: Patient Spontanous Breathing  Post-op Assessment: Report given to RN and Post -op Vital signs reviewed and stable  Post vital signs: Reviewed and stable  Last Vitals:  Vitals Value Taken Time  BP 132/71 09/02/24 13:15  Temp 36.6 C 09/02/24 13:14  Pulse 93 09/02/24 13:17  Resp 24 09/02/24 13:17  SpO2 97 % 09/02/24 13:17  Vitals shown include unfiled device data.  Last Pain:  Vitals:   09/02/24 1315  TempSrc:   PainSc: 0-No pain         Complications: No notable events documented.

## 2024-09-02 NOTE — Anesthesia Preprocedure Evaluation (Addendum)
 Anesthesia Evaluation  Patient identified by MRN, date of birth, ID band Patient awake    Reviewed: Allergy & Precautions, NPO status , Patient's Chart, lab work & pertinent test results  Airway Mallampati: II  TM Distance: >3 FB Neck ROM: Full    Dental no notable dental hx.    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    Reproductive/Obstetrics                              Anesthesia Physical Anesthesia Plan  ASA: 1  Anesthesia Plan: General   Post-op Pain Management:    Induction: Intravenous  PONV Risk Score and Plan: 2 and Ondansetron , Dexamethasone , Midazolam  and Treatment may vary due to age or medical condition  Airway Management Planned: Oral ETT  Additional Equipment:   Intra-op Plan:   Post-operative Plan: Extubation in OR  Informed Consent: I have reviewed the patients History and Physical, chart, labs and discussed the procedure including the risks, benefits and alternatives for the proposed anesthesia with the patient or authorized representative who has indicated his/her understanding and acceptance.     Dental advisory given  Plan Discussed with: CRNA  Anesthesia Plan Comments:          Anesthesia Quick Evaluation

## 2024-09-02 NOTE — Anesthesia Procedure Notes (Signed)
 Procedure Name: Intubation Date/Time: 09/02/2024 10:51 AM  Performed by: Vertie Arthea RAMAN, CRNAPre-anesthesia Checklist: Patient identified, Emergency Drugs available, Suction available and Patient being monitored Patient Re-evaluated:Patient Re-evaluated prior to induction Oxygen Delivery Method: Circle System Utilized Preoxygenation: Pre-oxygenation with 100% oxygen Induction Type: IV induction Ventilation: Mask ventilation without difficulty Laryngoscope Size: Mac and 3 Grade View: Grade I Tube type: Oral Tube size: 7.5 mm Number of attempts: 1 Airway Equipment and Method: Stylet and Oral airway Placement Confirmation: ETT inserted through vocal cords under direct vision, positive ETCO2 and breath sounds checked- equal and bilateral Tube secured with: Tape Dental Injury: Teeth and Oropharynx as per pre-operative assessment

## 2024-09-02 NOTE — Discharge Instructions (Addendum)
 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  May need to use crutches for a few days due to soreness  RANGE OF MOTION/ACTIVITY: No formal restrictions left leg.  Move hip, knee and ankle is much as possible.  Activity as tolerated.  Slowly increase activity  Bone health:   Review the following resource for additional information regarding bone health  [email or web address].com.cy  Wound Care: Daily wound care starting on 09/05/2024.  Please see below  Discharge Wound Care Instructions  Do NOT apply any ointments, solutions or lotions to pin sites or surgical wounds.  These prevent needed drainage and even though solutions like hydrogen peroxide kill bacteria, they also damage cells lining the pin sites that help fight infection.  Applying lotions or ointments can keep the wounds moist and can cause them to breakdown and open up as well. This can increase the risk for infection. When in doubt call the office.  Surgical incisions should be dressed daily.  If any drainage is noted, use one layer of adaptic or Mepitel, then gauze, Kerlix, and an ace wrap.  [email or web address] [email or web address]_rd_[email or web address]&[email or web address]  [email or web address].[email or web address]  These dressing supplies should be available at local medical supply stores ([facility] [employer], [facility], etc). They are not usually carried at places like [facility], [facility], [facility], etc  Once the incision is completely dry and without drainage, it may be left open to air out.  Showering may begin 36-48 hours later.  Cleaning gently with soap and water .  Traumatic  wounds should be dressed daily as well.    One layer of adaptic, gauze, Kerlix, then ace wrap.  The adaptic can be discontinued once the draining has ceased    If you have a wet to dry dressing: wet the gauze with saline the squeeze as much saline out so the gauze is moist (not soaking wet), place moistened gauze over wound, then place a dry gauze over the moist one, followed by Kerlix wrap, then ace wrap.  Diet: as you were eating previously.  Can use over the counter stool softeners and bowel preparations, such as Miralax , to help with bowel movements.  Narcotics can be constipating.  Be sure to drink plenty of fluids  PAIN MEDICATION USE AND EXPECTATIONS  You have likely been given narcotic medications to help control your pain.  After a traumatic event that results in an fracture (broken bone) with or without surgery, it is ok to use narcotic pain medications to help control one's pain.  We understand that everyone responds to pain differently and each individual patient will be evaluated on a regular basis for the continued need for narcotic medications. Ideally, narcotic medication use should last no more than 6-8 weeks (coinciding with fracture healing).   As a patient it is your responsibility as well to monitor narcotic medication use and report the amount and frequency you use these medications when you come to your office visit.   We would also advise that if you are using narcotic medications, you should take a dose prior to therapy to maximize you participation.  IF YOU ARE ON NARCOTIC MEDICATIONS IT IS NOT PERMISSIBLE TO OPERATE A MOTOR VEHICLE (MOTORCYCLE/CAR/TRUCK/MOPED) OR HEAVY MACHINERY DO NOT MIX NARCOTICS WITH OTHER CNS (CENTRAL NERVOUS SYSTEM) DEPRESSANTS SUCH AS ALCOHOL   POST-OPERATIVE OPIOID TAPER INSTRUCTIONS: It is important to wean off of your opioid medication as soon as possible.  If you do not need pain medication after your surgery it is ok to stop day one. Opioids  include: Codeine, Hydrocodone(Norco, Vicodin), Oxycodone (Percocet, oxycontin ) and hydromorphone  amongst others.  Long term and even short term use of opiods can cause: Increased pain response Dependence Constipation Depression Respiratory depression And more.  Withdrawal symptoms can include Flu like symptoms Nausea, vomiting And more Techniques to manage these symptoms Hydrate well Eat regular healthy meals Stay active Use relaxation techniques(deep breathing, meditating, yoga) Do Not substitute Alcohol to help with tapering If you have been on opioids for less than two weeks and do not have pain than it is ok to stop all together.  Plan to wean off of opioids This plan should start within one week post op of your fracture surgery  Maintain the same interval or time between taking each dose and first decrease the dose.  Cut the total daily intake of opioids by one tablet each day Next start to increase the time between doses. The last dose that should be eliminated is the evening dose.    STOP SMOKING OR USING NICOTINE PRODUCTS!!!!  As discussed nicotine severely impairs your body's ability to heal surgical and traumatic wounds but also impairs bone healing.  Wounds and bone heal by forming microscopic blood vessels (angiogenesis) and nicotine is a vasoconstrictor (essentially, shrinks blood vessels).  Therefore, if vasoconstriction occurs to these microscopic blood vessels they essentially disappear and are unable to deliver necessary nutrients to the healing tissue.  This is one modifiable factor that you can do to dramatically increase your chances of healing your injury.    (This means no smoking, no nicotine gum, patches, etc)  DO NOT USE NONSTEROIDAL ANTI-INFLAMMATORY DRUGS (NSAID'S)  Using products such as Advil (ibuprofen), Aleve (naproxen), Motrin (ibuprofen) for additional pain control during fracture healing can delay and/or prevent the healing response.  If you would  like to take over the counter (OTC) medication, Tylenol  (acetaminophen ) is ok.  However, some narcotic medications that are given for pain control contain acetaminophen  as well. Therefore, you should not exceed more than 4000 mg of tylenol  in a day if you do not have liver disease.  Also note that there are may OTC medicines, such as cold medicines and allergy medicines that my contain tylenol  as well.  If you have any questions about medications and/or interactions please ask your doctor/PA or your pharmacist.      ICE AND ELEVATE INJURED/OPERATIVE EXTREMITY  Using ice and elevating the injured extremity above your heart can help with swelling and pain control.  Icing in a pulsatile fashion, such as 20 minutes on and 20 minutes off, can be followed.    Do not place ice directly on skin. Make sure there is a barrier between to skin and the ice pack.    Using frozen items such as frozen peas works well as the conform nicely to the are that needs to be iced.  USE AN ACE WRAP OR TED HOSE FOR SWELLING CONTROL  In addition to icing and elevation, Ace wraps or TED hose are used to help limit and resolve swelling.  It is recommended to use Ace wraps or TED hose until you are informed to stop.    When using Ace Wraps start the wrapping distally (farthest away from the body) and wrap proximally (closer to the body)   Example: If you had surgery on your leg and you do not have a splint on, start the ace wrap at the toes  and work your way up to the thigh        If you had surgery on your upper extremity and do not have a splint on, start the ace wrap at your fingers and work your way up to the upper arm  IF YOU ARE IN A SPLINT OR CAST DO NOT REMOVE IT FOR ANY REASON   If your splint gets wet for any reason please contact the office immediately. You may shower in your splint or cast as long as you keep it dry.  This can be done by wrapping in a cast cover or garbage back (or similar)  Do Not stick any thing down  your splint or cast such as pencils, money, or hangers to try and scratch yourself with.  If you feel itchy take benadryl as prescribed on the bottle for itching  IF YOU ARE IN A CAM BOOT (BLACK BOOT)  You may remove boot periodically. Perform daily dressing changes as noted below.  Wash the liner of the boot regularly and wear a sock when wearing the boot. It is recommended that you sleep in the boot until told otherwise    Call office for the following: Temperature greater than 101F Persistent nausea and vomiting Severe uncontrolled pain Redness, tenderness, or signs of infection (pain, swelling, redness, odor or green/yellow discharge around the site) Difficulty breathing, headache or visual disturbances Hives Persistent dizziness or light-headedness Extreme fatigue Any other questions or concerns you may have after discharge  In an emergency, call 911 or go to an Emergency Department at a nearby hospital  HELPFUL INFORMATION  If you had a block, it will wear off between 8-24 hrs postop typically.  This is period when your pain may go from nearly zero to the pain you would have had postop without the block.  This is an abrupt transition but nothing dangerous is happening.  You may take an extra dose of narcotic when this happens.  You should wean off your narcotic medicines as soon as you are able.  Most patients will be off or using minimal narcotics before their first postop appointment.   We suggest you use the pain medication the first night prior to going to bed, in order to ease any pain when the anesthesia wears off. You should avoid taking pain medications on an empty stomach as it will make you nauseous.  Do not drink alcoholic beverages or take illicit drugs when taking pain medications.  In most states it is against the law to drive while you are in a splint or sling.  And certainly against the law to drive while taking narcotics.  You may return to work/school in the  next couple of days when you feel up to it.   Pain medication may make you constipated.  Below are a few solutions to try in this order: Decrease the amount of pain medication if you aren't having pain. Drink lots of decaffeinated fluids. Drink prune juice and/or each dried prunes  If the first 3 don't work start with additional solutions Take Colace - an over-the-counter stool softener Take Senokot - an over-the-counter laxative Take Miralax  - a stronger over-the-counter laxative     CALL THE OFFICE WITH ANY QUESTIONS OR CONCERNS: [phone]   VISIT OUR WEBSITE FOR ADDITIONAL INFORMATION: [email or web address]

## 2024-09-03 ENCOUNTER — Encounter (HOSPITAL_COMMUNITY): Payer: Self-pay | Admitting: Orthopedic Surgery

## 2024-09-03 NOTE — Anesthesia Postprocedure Evaluation (Signed)
 Anesthesia Post Note  Patient: Carl Langston Jr.  Procedure(s) Performed: INSERTION, INTRAMEDULLARY ROD, TIBIA (Left)     Patient location during evaluation: PACU Anesthesia Type: General Level of consciousness: awake Pain management: pain level controlled Vital Signs Assessment: post-procedure vital signs reviewed and stable Respiratory status: spontaneous breathing, nonlabored ventilation and respiratory function stable Cardiovascular status: blood pressure returned to baseline and stable Postop Assessment: no apparent nausea or vomiting Anesthetic complications: no   No notable events documented.  Last Vitals:  Vitals:   09/02/24 1400 09/02/24 1415  BP: 124/75 122/65  Pulse: 97 83  Resp: 19 19  Temp:  36.6 C  SpO2: 96% 95%    Last Pain:  Vitals:   09/02/24 1421  TempSrc:   PainSc: 4                  Imajean Mcdermid P Cordarro Spinnato

## 2024-09-07 LAB — AEROBIC/ANAEROBIC CULTURE W GRAM STAIN (SURGICAL/DEEP WOUND): Culture: NO GROWTH

## 2024-10-04 ENCOUNTER — Telehealth: Payer: Self-pay

## 2024-10-04 NOTE — Telephone Encounter (Signed)
 Copied from CRM #[id number]. Topic: Clinical - Medical Advice >> Oct 04, 2024  3:57 PM Emylou G wrote: Reason for CRM: patient called.. had question about getting weightloss prescribed?  Or does he need to be seen - it was discussed in August.

## 2024-10-06 NOTE — Op Note (Signed)
 NAME:  MEDICAL RECORD NO: [id number] ACCOUNT NO: [id number] DATE OF BIRTH: 11-03-1995 FACILITY: [facility] LOCATION: [facility] PHYSICIAN: Ozell DEL. Celena, MD  Operative Report   DATE OF PROCEDURE: 09/02/2024  PREOPERATIVE DIAGNOSES:   1.  Left tibia nonunion. 2.  Symptomatic hardware, left tibia. 3.  Symptomatic hardware, proximal fibula.  POSTOPERATIVE DIAGNOSES:   1.  Left tibia nonunion. 2.  Symptomatic hardware, left tibia. 3.  Symptomatic hardware, proximal fibula.  PROCEDURES: 1.  Intramedullary nailing of the left tibia using a 9 x 345 mm Synthes nail without locking bolts. 2.  Removal of symptomatic hardware, left tibia. 3.  Removal of symptomatic hardware, left fibula. 4.  Application of stress under fluoroscopy.  SURGEON:  Ozell Celena, MD  ASSISTANT:  Francis Mt, PA-C  ANESTHESIA:  General.  SPECIMENS:  Reamings-- anaerobic and aerobic specimens sent to micro.  COMPLICATIONS:  None.  DISPOSITION:  To PACU.  CONDITION:  Stable.  BRIEF SUMMARY OF INDICATIONS FOR PROCEDURE:  The patient is a very pleasant 29 year old male polytrauma patient who has had persistent left leg pain in the knee and tibial shaft and exquisitely so at the locking bolts for several months.  These symptoms  have failed to resolve with conservative measures and he wished to have them removed.  Prior to doing so, we obtained a CT scan which showed occult nonunion which was hypertrophic at the shaft as well as loosening of the hardware spanning the fibula and  tibia proximally but without breakage.  Consequently I discussed with the patient the recommendation for exchange nailing with or without additional locking bolts, which he strongly wished to avoid, as well as removal of all symptomatic hardware and also  stress evaluation of the tibia.  He acknowledged these risks and did provide consent to proceed.  BRIEF SUMMARY OF PROCEDURE:  The patient was taken to the operating room where general  anesthesia was induced.  The left lower extremity was prepped and draped in usual sterile fashion, timeout was then held.  I began with identification and removal of  all locking bolts both proximally and distally in the tibia.  This was assisted with fluoroscopy.  Proximally, I remade the medial parapatellar incision and then went down to the tip of the nail proximally just outside and anterior to the knee joint.  I  was able to engage the nail securely and withdraw it with the back slapping mallet.  Once it was removed, a guidewire was placed, C-arm was brought in and sequential reaming performed from 8-10.  I encountered a pretty significant chatter at 9 and the  previous nail was an 8.  Reamings were sent for anaerobic and aerobic culture.  Following the reamings, stress was applied directly to the fracture site under live fluoroscopy.  This did not show any cantilever and indicated sufficient stability that no  locking bolts were required.  Consequently the incisions used for removal of the hardware were therefore distinct from what we might have placed simply for repair of the nonunion.  After placement of the nail, attention was then turned proximally to the  head of the screw on the lateral aspect of the fibula. I separate incision was made here. I was very careful to put a pin into this cannulated screw so as to avoid injury to the peroneal nerve at the fibular neck.  I then engaged the driver and withdrew the screw and left the washer because of  again concern about potential injury to the nerve.  All wounds  were irrigated thoroughly and closed in standard layered fashion.  Francis Mt, PA-C, was present assisting throughout.  A sterile gently compressive dressing again was applied, there was no  need for any splinting.  PROGNOSIS:  The patient will be weightbearing as tolerated.  He did receive perioperative antibiotics as soon as cultures were taken, no range of motion restrictions.  We are  hopeful that he will have significant improvement in comfort now that all the  locking bolts have been removed both from his tibia and the fibula.  PLAN:  We will plan to see him back in the office for reevaluation, removal of sutures in 10 days.  SHW D: 10/06/2024 11:31:29 pm T: 10/06/2024 11:52:00 pm  JOB: [id number]/ [id number]

## 2024-10-12 ENCOUNTER — Encounter: Payer: Self-pay | Admitting: Family Medicine

## 2024-10-12 ENCOUNTER — Ambulatory Visit: Admitting: Family Medicine

## 2024-10-12 VITALS — BP 129/83 | HR 81 | Temp 98.0°F | Ht 65.0 in | Wt 172.8 lb

## 2024-10-12 DIAGNOSIS — N35014 Post-traumatic urethral stricture, male, unspecified: Secondary | ICD-10-CM | POA: Diagnosis not present

## 2024-10-12 DIAGNOSIS — E663 Overweight: Secondary | ICD-10-CM | POA: Diagnosis not present

## 2024-10-12 DIAGNOSIS — N322 Vesical fistula, not elsewhere classified: Secondary | ICD-10-CM

## 2024-10-12 NOTE — Progress Notes (Signed)
 "  Acute Office Visit  Patient ID: Carl Daubert., male    DOB: 1996-04-17, 29 y.o.   MRN: [id number]  PCP: Kayla Carl RAMAN, FNP  Chief Complaint  Patient presents with   Medical Management of Chronic Issues    Discuss wt. Loss  Having difficulty exercising to loose weight due to previous injuries.  Notices when he does work out he has urine leakage.      Subjective:     HPI  Discussed the use of AI scribe software for clinical note transcription with the patient, who gave verbal consent to proceed.  History of Present Illness Carl Geise. is a 29 year old male with a history of urethral fistula who presents with concerns about urinary leakage during exercise and weight management.  He recently underwent surgery to replace a rod in his left leg due to a hairline fracture. The screws were removed, and the new rod is intended to aid in healing. Post-surgery, he has been attempting to lose weight but experiences urinary leakage and pain after exercising, requiring several days of recovery.  During his exercise routine, he noticed unexpected urinary leakage, which he initially mistook for sweat. This leakage began in the third week of his workout regimen, characterized by activities such as crutches and sit-ups. The leakage persisted for about a week before resolving. 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 Was released from urology with no restrictions.  He is currently engaging in daily walking and treadmill exercises, avoiding running due to concerns about his leg. 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 Despite these efforts, he experienced urinary leakage during strenuous activities, which has since stopped.  He reports a sensation of his bladder moving during  physical exertion, particularly when performing abdominal exercises. He describes a 'weird' feeling rather than pain when pressure is applied to his abdomen, likely due to previous surgical incisions.  He is concerned about managing his weight and has lost seven pounds with the help of his trainer. He aims to lose an additional twenty pounds to reach his goal weight of 155 pounds. His diet is inconsistent, often influenced by social activities.  No current urinary leakage or blood in urine. Pain during coughing when exercising.   Review of Systems  All other systems reviewed and are negative.   Past Medical History:  Diagnosis Date   History of blood transfusion 04/01/2023   with surgery   Pneumonia    x 1    Past Surgical History:  Procedure Laterality Date   BLADDER REPAIR  03/15/2023   Procedure: BLADDER REPAIR;  Surgeon: Lovie Arlyss CROME, MD;  Location: [facility] OR;  Service: Urology;;   CLOSED REDUCTION ELBOW FRACTURE Right 03/17/2023   Procedure: CLOSED REDUCTION ELBOW;  Surgeon: Celena Sharper, MD;  Location: [facility] OR;  Service: Orthopedics;  Laterality: Right;   CLOSED REDUCTION RADIAL SHAFT  03/15/2023   Procedure: CLOSED REDUCTION SPLINT APPLICATION RIGHT ARM;  Surgeon: Kit Rush, MD;  Location: [facility] OR;  Service: Orthopedics;;   CYSTOSCOPY WITH RETROGRADE URETHROGRAM  03/15/2023   Procedure: CYSTOSCOPY WITH RETROGRADE URETHROGRAM;  Surgeon: Lovie Arlyss CROME, MD;  Location: [facility] OR;  Service: Urology;;   CYSTOURETHROSCOPY  06/03/2023   in [facility]   CYSTOURETHROSCOPY, WITH INSERTION OF INDWELLING URETERAL STENT  09/18/2023   stents have  been removed - in [facility]   DEBRIDEMENT AND CLOSURE WOUND N/A 04/02/2023   Procedure: EXAM UNDER ANESTHESIA, IRRIGATION AND DEBRIDEMENT, PARTIAL CLOSURE OF  PERINEAL REGION WOUND;  Surgeon: Waddell Leonce NOVAK, MD;  Location: [facility] OR;  Service: Plastics;  Laterality: N/A;   DEBRIDEMENT AND CLOSURE WOUND Left 05/06/2023   Procedure: Examination of perineal wounds  under anesthesia, placement of myriad and closure of perineal wounds as appropriate;  Surgeon: Waddell Leonce NOVAK, MD;  Location: [facility] OR;  Service: Plastics;  Laterality: Left;   ELBOW SURGERY     EXPLORATORY LAPAROTOMY  07/09/2023   in [facility]   EXTERNAL FIXATION LEG Left 03/15/2023   Procedure: EXTERNAL FIXATION LEG;  Surgeon: Kit Rush, MD;  Location: [facility] OR;  Service: Orthopedics;  Laterality: Left;   EXTERNAL FIXATION PELVIS Left 03/17/2023   Procedure: EXTERNAL FIXATION PELVIS;  Surgeon: Celena Sharper, MD;  Location: [facility] OR;  Service: Orthopedics;  Laterality: Left;   EXTERNAL FIXATION PELVIS  03/15/2023   Procedure: EXTERNAL FIXATION PELVIS;  Surgeon: Kit Rush, MD;  Location: [facility] OR;  Service: Orthopedics;;   EXTERNAL FIXATION REMOVAL N/A 05/13/2023   Procedure: REMOVAL EXTERNAL FIXATION PELVIS, CURETTAGE OF PIN SITES;  Surgeon: Celena Sharper, MD;  Location: [facility] OR;  Service: Orthopedics;  Laterality: N/A;   HIP CLOSED REDUCTION Left 03/15/2023   Procedure: CLOSED REDUCTION HIP;  Surgeon: Kit Rush, MD;  Location: [facility] OR;  Service: Orthopedics;  Laterality: Left;   I & D EXTREMITY Bilateral 03/17/2023   Procedure: IRRIGATION AND DEBRIDEMENT BILATERAL LOWER EXTREMITIES;  Surgeon: Celena Sharper, MD;  Location: [facility] OR;  Service: Orthopedics;  Laterality: Bilateral;   I & D EXTREMITY  03/15/2023   Procedure: IRRIGATION AND DEBRIDEMENT EXTREMITY;  Surgeon: Dasie Leonor CROME, MD;  Location: [facility] OR;  Service: General;;   I & D EXTREMITY Left 03/15/2023   Procedure: IRRIGATION AND DEBRIDEMENT EXTREMITY;  Surgeon: Kit Rush, MD;  Location: [facility] OR;  Service: Orthopedics;  Laterality: Left;   INCISION AND DRAINAGE OF WOUND N/A 03/17/2023   Procedure: IRRIGATION AND DEBRIDEMENT PERINEAL;  Surgeon: Stevie, Herlene Righter, MD;  Location: [facility] OR;  Service: General;  Laterality: N/A;   INCISION AND DRAINAGE PERIRECTAL ABSCESS N/A 03/20/2023   Procedure: IRRIGATION AND DEBRIDEMENT PERINEUM WITH MYRIAD  APPLICATION;  Surgeon: Paola Dreama SAILOR, MD;  Location: [facility] OR;  Service: General;  Laterality: N/A;   INSERTION OF SUPRAPUBIC CATHETER  03/15/2023   Procedure: CYSTOTOMY WITH INSERTION OF SUPRAPUBIC CATHETER;  Surgeon: Lovie Arlyss CROME, MD;  Location: [facility] OR;  Service: Urology;;   LACERATION REPAIR  03/17/2023   Procedure: REPAIR MULTIPLE LACERATIONS;  Surgeon: Celena Sharper, MD;  Location: [facility] OR;  Service: Orthopedics;;   LACERATION REPAIR  03/15/2023   Procedure: LACERATION REPAIR;  Surgeon: Kit Rush, MD;  Location: [facility] OR;  Service: Orthopedics;;   LACERATION REPAIR  03/15/2023   Procedure: URETHRAL REPAIR OF LACERATION;  Surgeon: Lovie Arlyss CROME, MD;  Location: [facility] OR;  Service: Urology;;   ORIF ELBOW FRACTURE Right 03/20/2023   Procedure: OPEN REDUCTION INTERNAL FIXATION (ORIF) RIGHT ELBOW FRACTURE;  Surgeon: Celena Sharper, MD;  Location: [facility] OR;  Service: Orthopedics;  Laterality: Right;   RECONSTRUC ANT MALE URETHRA  08/2023   in [facility]   SCROTOPLASTY  08/2023   in [facility]   TIBIA IM NAIL INSERTION Left 03/20/2023   Procedure: INTRAMEDULLARY (IM) NAIL TIBIAL;  Surgeon: Celena Sharper, MD;  Location: [facility] OR;  Service: Orthopedics;  Laterality: Left;   TIBIA IM NAIL INSERTION Left 09/02/2024  Procedure: INSERTION, INTRAMEDULLARY ROD, TIBIA;  Surgeon: Celena Sharper, MD;  Location: [facility] OR;  Service: Orthopedics;  Laterality: Left;    Outpatient Medications Prior to Visit  Medication Sig Dispense Refill   acetaminophen  (TYLENOL ) 500 MG tablet Take 500-1,000 mg by mouth every 6 (six) hours as needed for moderate pain.     ketorolac  (TORADOL ) 10 MG tablet Take 1 tablet (10 mg total) by mouth every 6 (six) hours as needed. (Patient not taking: Reported on 10/12/2024) 20 tablet 0   methocarbamol  (ROBAXIN ) 500 MG tablet Take 1-2 tablets (500-1,000 mg total) by mouth every 6 (six) hours as needed. (Patient not taking: Reported on 10/12/2024) 60 tablet 0   ondansetron  (ZOFRAN -ODT) 4 MG disintegrating  tablet Take 1 tablet (4 mg total) by mouth every 8 (eight) hours as needed. (Patient not taking: Reported on 10/12/2024) 20 tablet 0   oxyCODONE -acetaminophen  (PERCOCET) 7.5-325 MG tablet Take 1-2 tablets by mouth every 8 (eight) hours as needed for moderate pain (pain score 4-6) or severe pain (pain score 7-10). (Patient not taking: Reported on 10/12/2024) 40 tablet 0   No facility-administered medications prior to visit.    Allergies[1]     Objective:    BP 129/83   Pulse 81   Temp 98 F (36.7 C)   Ht 5' 5 (1.651 m)   Wt 172 lb 12.8 oz (78.4 kg)   SpO2 97%   BMI 28.76 kg/m    Physical Exam Vitals and nursing note reviewed.  Constitutional:      Appearance: Normal appearance. He is normal weight.  HENT:     Head: Normocephalic and atraumatic.  Abdominal:     General: Abdomen is flat. There is no distension.     Palpations: Abdomen is soft.     Tenderness: There is abdominal tenderness in the suprapubic area.     Hernia: No hernia is present.  Skin:    General: Skin is warm and dry.     Capillary Refill: Capillary refill takes less than 2 seconds.  Neurological:     General: No focal deficit present.     Mental Status: He is alert and oriented to person, place, and time. Mental status is at baseline.  Psychiatric:        Mood and Affect: Mood normal.        Behavior: Behavior normal.        Thought Content: Thought content normal.        Judgment: Judgment normal.       No results found for any visits on 10/12/24.     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 No current leakage but  previous episodes with exercise. - Consult urology to discuss urinary leakage and ensure no disruption in repair. - Avoid activities that exacerbate symptoms until cleared by urology. - Use a binder during workouts if needed to support the abdominal area. - Continue gradual increase in physical activity as tolerated. - Monitor for pain or discomfort during exercise and adjust activity level accordingly.  Overweight and weight management Desire to lose weight to 155 pounds. Current weight management includes walking and  gym activities. Previous weight loss of 7 pounds with trainer's guidance. - Use My Fitness Pal app to track calorie intake and set goals. - Focus on nutrient-dense foods and reduce sweets and carbohydrates. - Continue walking and light exercise as tolerated. - Consult with a trainer for dietary guidance and support.    No orders of the defined types were placed in this encounter.   Return if symptoms worsen or fail to improve.  Carl GORMAN Barrio, FNP [facility]      [1]  Allergies Allergen Reactions   Walnut Swelling   "

## 2024-10-20 ENCOUNTER — Ambulatory Visit: Admitting: Family Medicine

## 2024-10-28 ENCOUNTER — Other Ambulatory Visit (HOSPITAL_COMMUNITY): Payer: Self-pay

## 2025-05-13 ENCOUNTER — Other Ambulatory Visit

## 2025-05-18 ENCOUNTER — Encounter: Admitting: Family Medicine
